# Patient Record
Sex: Male | Born: 1950 | Hispanic: Yes | Marital: Single | Smoking: Never smoker
Health system: Southern US, Community
[De-identification: ages and names within clinical notes are randomized; demographics above are authoritative.]

---

## 2014-03-16 HISTORY — PX: TENDON REPAIR: SHX5111

## 2019-03-17 HISTORY — PX: FRACTURE SURGERY: SHX138

## 2019-07-26 ENCOUNTER — Inpatient Hospital Stay (HOSPITAL_COMMUNITY)
Admission: EM | Admit: 2019-07-26 | Discharge: 2019-08-04 | DRG: 488 | Disposition: A | Discharge status: Home / Self Care | Payer: Medicaid Other | Attending: Orthopedic Surgery | Admitting: Orthopedic Surgery

## 2019-07-26 ENCOUNTER — Emergency Department (HOSPITAL_COMMUNITY): Payer: Medicaid Other

## 2019-07-26 ENCOUNTER — Inpatient Hospital Stay (HOSPITAL_COMMUNITY): Payer: Medicaid Other

## 2019-07-26 DIAGNOSIS — E8889 Other specified metabolic disorders: Secondary | ICD-10-CM | POA: Diagnosis present

## 2019-07-26 DIAGNOSIS — S83282A Other tear of lateral meniscus, current injury, left knee, initial encounter: Secondary | ICD-10-CM | POA: Diagnosis present

## 2019-07-26 DIAGNOSIS — S52182A Other fracture of upper end of left radius, initial encounter for closed fracture: Secondary | ICD-10-CM

## 2019-07-26 DIAGNOSIS — Y990 Civilian activity done for income or pay: Secondary | ICD-10-CM

## 2019-07-26 DIAGNOSIS — Z7289 Other problems related to lifestyle: Secondary | ICD-10-CM | POA: Diagnosis not present

## 2019-07-26 DIAGNOSIS — T148XXA Other injury of unspecified body region, initial encounter: Secondary | ICD-10-CM

## 2019-07-26 DIAGNOSIS — E559 Vitamin D deficiency, unspecified: Secondary | ICD-10-CM | POA: Diagnosis present

## 2019-07-26 DIAGNOSIS — W19XXXA Unspecified fall, initial encounter: Secondary | ICD-10-CM

## 2019-07-26 DIAGNOSIS — S82143A Displaced bicondylar fracture of unspecified tibia, initial encounter for closed fracture: Secondary | ICD-10-CM | POA: Diagnosis present

## 2019-07-26 DIAGNOSIS — S82142A Displaced bicondylar fracture of left tibia, initial encounter for closed fracture: Principal | ICD-10-CM | POA: Diagnosis present

## 2019-07-26 DIAGNOSIS — M25062 Hemarthrosis, left knee: Secondary | ICD-10-CM | POA: Diagnosis present

## 2019-07-26 DIAGNOSIS — Z419 Encounter for procedure for purposes other than remedying health state, unspecified: Secondary | ICD-10-CM

## 2019-07-26 DIAGNOSIS — F17210 Nicotine dependence, cigarettes, uncomplicated: Secondary | ICD-10-CM | POA: Diagnosis present

## 2019-07-26 DIAGNOSIS — T79A22A Traumatic compartment syndrome of left lower extremity, initial encounter: Secondary | ICD-10-CM | POA: Diagnosis present

## 2019-07-26 DIAGNOSIS — W1789XA Other fall from one level to another, initial encounter: Secondary | ICD-10-CM | POA: Diagnosis present

## 2019-07-26 DIAGNOSIS — D62 Acute posthemorrhagic anemia: Secondary | ICD-10-CM | POA: Diagnosis not present

## 2019-07-26 DIAGNOSIS — Y9389 Activity, other specified: Secondary | ICD-10-CM

## 2019-07-26 DIAGNOSIS — S82102A Unspecified fracture of upper end of left tibia, initial encounter for closed fracture: Secondary | ICD-10-CM

## 2019-07-26 DIAGNOSIS — S52121A Displaced fracture of head of right radius, initial encounter for closed fracture: Secondary | ICD-10-CM

## 2019-07-26 DIAGNOSIS — Z20822 Contact with and (suspected) exposure to covid-19: Secondary | ICD-10-CM | POA: Diagnosis present

## 2019-07-26 DIAGNOSIS — S52132A Displaced fracture of neck of left radius, initial encounter for closed fracture: Secondary | ICD-10-CM | POA: Diagnosis present

## 2019-07-26 DIAGNOSIS — Y92524 Gas station as the place of occurrence of the external cause: Secondary | ICD-10-CM

## 2019-07-26 LAB — CBC WITH DIFFERENTIAL/PLATELET
Abs Immature Granulocytes: 0.05 10*3/uL (ref 0.00–0.07)
Basophils Absolute: 0 10*3/uL (ref 0.0–0.1)
Basophils Relative: 0 %
Eosinophils Absolute: 0.3 10*3/uL (ref 0.0–0.5)
Eosinophils Relative: 3 %
HCT: 39.7 % (ref 39.0–52.0)
Hemoglobin: 13.3 g/dL (ref 13.0–17.0)
Immature Granulocytes: 0 %
Lymphocytes Relative: 12 %
Lymphs Abs: 1.3 10*3/uL (ref 0.7–4.0)
MCH: 31.8 pg (ref 26.0–34.0)
MCHC: 33.5 g/dL (ref 30.0–36.0)
MCV: 95 fL (ref 80.0–100.0)
Monocytes Absolute: 0.7 10*3/uL (ref 0.1–1.0)
Monocytes Relative: 6 %
Neutro Abs: 8.8 10*3/uL — ABNORMAL HIGH (ref 1.7–7.7)
Neutrophils Relative %: 79 %
Platelets: 240 10*3/uL (ref 150–400)
RBC: 4.18 MIL/uL — ABNORMAL LOW (ref 4.22–5.81)
RDW: 12.8 % (ref 11.5–15.5)
WBC: 11.2 10*3/uL — ABNORMAL HIGH (ref 4.0–10.5)
nRBC: 0 % (ref 0.0–0.2)

## 2019-07-26 LAB — BASIC METABOLIC PANEL
Anion gap: 8 (ref 5–15)
BUN: 13 mg/dL (ref 8–23)
CO2: 24 mmol/L (ref 22–32)
Calcium: 8.9 mg/dL (ref 8.9–10.3)
Chloride: 104 mmol/L (ref 98–111)
Creatinine, Ser: 0.85 mg/dL (ref 0.61–1.24)
GFR calc Af Amer: >60 mL/min (ref >60)
GFR calc non Af Amer: >60 mL/min (ref >60)
Glucose, Bld: 96 mg/dL (ref 70–99)
Potassium: 4.2 mmol/L (ref 3.5–5.1)
Sodium: 136 mmol/L (ref 135–145)

## 2019-07-26 LAB — SARS CORONAVIRUS 2 BY RT PCR (HOSPITAL ORDER, PERFORMED IN ~~LOC~~ HOSPITAL LAB): SARS Coronavirus 2: NEGATIVE

## 2019-07-26 MED ORDER — MORPHINE SULFATE (PF) 4 MG/ML IV SOLN
4.0000 mg | INTRAVENOUS | Status: DC | PRN
  Administered 2019-07-26: 23:00:00 4 mg via INTRAVENOUS
  Filled 2019-07-26: qty 1

## 2019-07-26 MED ORDER — POTASSIUM CHLORIDE IN NACL 20-0.9 MEQ/L-% IV SOLN
INTRAVENOUS | Status: DC
  Filled 2019-07-26 (×2): qty 1000

## 2019-07-26 MED ORDER — CEFAZOLIN SODIUM-DEXTROSE 2-4 GM/100ML-% IV SOLN
2.0000 g | INTRAVENOUS | Status: AC
  Administered 2019-07-27: 2 g via INTRAVENOUS
  Filled 2019-07-26: qty 100

## 2019-07-26 MED ORDER — HYDROMORPHONE HCL 1 MG/ML IJ SOLN
0.5000 mg | Freq: Once | INTRAMUSCULAR | Status: AC
  Administered 2019-07-26: 17:00:00 0.5 mg via INTRAVENOUS
  Filled 2019-07-26: qty 1

## 2019-07-26 MED ORDER — CHLORHEXIDINE GLUCONATE 4 % EX LIQD
60.0000 mL | Freq: Once | CUTANEOUS | Status: DC
  Administered 2019-07-27: 4 via TOPICAL

## 2019-07-26 MED ORDER — HYDROCODONE-ACETAMINOPHEN 5-325 MG PO TABS
2.0000 | ORAL_TABLET | ORAL | Status: DC | PRN
  Administered 2019-07-27: 2 via ORAL
  Filled 2019-07-26: qty 2

## 2019-07-26 MED ORDER — POVIDONE-IODINE 10 % EX SWAB: 2.0000 "application " | Freq: Once | CUTANEOUS | Status: DC

## 2019-07-26 MED ORDER — MORPHINE SULFATE (PF) 4 MG/ML IV SOLN: 4.0000 mg | Freq: Once | INTRAVENOUS | Status: DC
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
## 2019-07-26 NOTE — Progress Notes (Addendum)
Orthopedic Tech Progress Note Patient Details:  Jaideep Pollack Jul 23, 1950 [id number]  Ortho Devices Type of Ortho Device: Arm sling, Post (long arm) splint, Post (long leg) splint Ortho Device/Splint Location: lue, lle Ortho Device/Splint Interventions: Ordered, Application, Adjustment  I assisted tevin with application Post Interventions Patient Tolerated: Well Instructions Provided: Care of device, Adjustment of device   Trinna Post 07/26/2019, 8:57 PM
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
## 2019-07-26 NOTE — Progress Notes (Signed)
Radiographs of elbow and knee reviewed.  Comminuted radial head fracture present.  Complex tibial plateau fracture lateral aspect also present.  Plan for admission and evaluation by trauma service.  I have discussed these findings with Dr. Carola Frost.  Long-leg splint plus posterior splint for the arm.  N.p.o. after midnight.
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
## 2019-07-26 NOTE — Progress Notes (Signed)
Orthopedic Tech Progress Note Patient Details:  Kennard Fildes 21-Dec-1950 [id number]  Patient ID: Salik Grewell, male   DOB: 08-06-1950, 69 y.o.   MRN: [id number] Removal and reapplication of splint with extra bulky padding at dr deans request with dr deans assist.  Trinna Post 07/26/2019, 10:53 PM

## 2019-07-27 ENCOUNTER — Inpatient Hospital Stay (HOSPITAL_COMMUNITY): Payer: Medicaid Other

## 2019-07-27 ENCOUNTER — Encounter (HOSPITAL_COMMUNITY): Payer: Self-pay | Admitting: Orthopedic Surgery

## 2019-07-27 ENCOUNTER — Encounter (HOSPITAL_COMMUNITY)
Admission: EM | Disposition: A | Discharge status: Home / Self Care | Payer: Self-pay | Source: Home / Self Care | Attending: Orthopedic Surgery

## 2019-07-27 ENCOUNTER — Inpatient Hospital Stay (HOSPITAL_COMMUNITY): Payer: Medicaid Other | Admitting: Anesthesiology

## 2019-07-27 ENCOUNTER — Other Ambulatory Visit: Payer: Self-pay

## 2019-07-27 DIAGNOSIS — S82142A Displaced bicondylar fracture of left tibia, initial encounter for closed fracture: Secondary | ICD-10-CM | POA: Diagnosis present

## 2019-07-27 HISTORY — PX: EXTERNAL FIXATION LEG: SHX1549

## 2019-07-27 HISTORY — PX: FASCIOTOMY: SHX132

## 2019-07-27 HISTORY — DX: Displaced bicondylar fracture of left tibia, initial encounter for closed fracture: S82.142A

## 2019-07-27 HISTORY — PX: APPLICATION OF WOUND VAC: SHX5189

## 2019-07-27 HISTORY — PX: ORIF ELBOW FRACTURE: SHX5031

## 2019-07-27 LAB — HIV ANTIBODY (ROUTINE TESTING W REFLEX): HIV Screen 4th Generation wRfx: NONREACTIVE

## 2019-07-27 LAB — SURGICAL PCR SCREEN
MRSA, PCR: NEGATIVE
Staphylococcus aureus: NEGATIVE

## 2019-07-27 LAB — PHOSPHORUS: Phosphorus: 4 mg/dL (ref 2.5–4.6)

## 2019-07-27 LAB — MAGNESIUM: Magnesium: 1.9 mg/dL (ref 1.7–2.4)

## 2019-07-27 SURGERY — EXTERNAL FIXATION, LOWER EXTREMITY
Anesthesia: General | Site: Leg Lower | Laterality: Left

## 2019-07-27 MED ORDER — ONDANSETRON HCL 4 MG/2ML IJ SOLN
INTRAMUSCULAR | Status: AC
  Filled 2019-07-27: qty 2

## 2019-07-27 MED ORDER — LORAZEPAM 1 MG PO TABS: 1.0000 mg | ORAL_TABLET | ORAL | Status: AC | PRN

## 2019-07-27 MED ORDER — ONDANSETRON HCL 4 MG/2ML IJ SOLN: 4.0000 mg | Freq: Once | INTRAMUSCULAR | Status: DC | PRN

## 2019-07-27 MED ORDER — ENOXAPARIN SODIUM 40 MG/0.4ML ~~LOC~~ SOLN
40.0000 mg | SUBCUTANEOUS | Status: DC
  Administered 2019-07-28 – 2019-08-04 (×7): 40 mg via SUBCUTANEOUS
  Filled 2019-07-27 (×7): qty 0.4

## 2019-07-27 MED ORDER — ASCORBIC ACID 500 MG PO TABS
500.0000 mg | ORAL_TABLET | Freq: Every day | ORAL | Status: DC
  Administered 2019-07-28 – 2019-08-01 (×4): 500 mg via ORAL
  Filled 2019-07-27 (×4): qty 1

## 2019-07-27 MED ORDER — HYDROCODONE-ACETAMINOPHEN 5-325 MG PO TABS
1.0000 | ORAL_TABLET | Freq: Four times a day (QID) | ORAL | Status: DC | PRN
  Administered 2019-07-27 – 2019-08-02 (×5): 2 via ORAL
  Administered 2019-08-03: 1 via ORAL
  Filled 2019-07-27 (×2): qty 2
  Filled 2019-07-27: qty 1
  Filled 2019-07-27 (×3): qty 2

## 2019-07-27 MED ORDER — GLYCOPYRROLATE 0.2 MG/ML IJ SOLN
INTRAMUSCULAR | Status: DC | PRN
Start: 2019-07-27 — End: 2019-07-27
  Administered 2019-07-27 (×2): .1 mg via INTRAVENOUS

## 2019-07-27 MED ORDER — FENTANYL CITRATE (PF) 250 MCG/5ML IJ SOLN
INTRAMUSCULAR | Status: AC
  Filled 2019-07-27: qty 5

## 2019-07-27 MED ORDER — METOCLOPRAMIDE HCL 5 MG PO TABS: 5.0000 mg | ORAL_TABLET | Freq: Three times a day (TID) | ORAL | Status: DC | PRN

## 2019-07-27 MED ORDER — ONE-A-DAY MENS PO TABS: 1.0000 | ORAL_TABLET | Freq: Every day | ORAL | Status: DC

## 2019-07-27 MED ORDER — LIDOCAINE 2% (20 MG/ML) 5 ML SYRINGE
INTRAMUSCULAR | Status: DC | PRN
  Administered 2019-07-27: 80 mg via INTRAVENOUS

## 2019-07-27 MED ORDER — SPIRITUS FRUMENTI
1.0000 | Freq: Three times a day (TID) | ORAL | Status: DC
  Administered 2019-07-27 – 2019-08-04 (×19): 1 via ORAL
  Filled 2019-07-27 (×26): qty 1

## 2019-07-27 MED ORDER — OXYCODONE HCL 5 MG PO TABS: 5.0000 mg | ORAL_TABLET | Freq: Once | ORAL | Status: DC | PRN

## 2019-07-27 MED ORDER — ONDANSETRON HCL 4 MG/2ML IJ SOLN
INTRAMUSCULAR | Status: DC | PRN
  Administered 2019-07-27: 4 mg via INTRAVENOUS

## 2019-07-27 MED ORDER — LACTATED RINGERS IV SOLN: INTRAVENOUS | Status: DC

## 2019-07-27 MED ORDER — LIDOCAINE 2% (20 MG/ML) 5 ML SYRINGE
INTRAMUSCULAR | Status: AC
  Filled 2019-07-27: qty 5

## 2019-07-27 MED ORDER — LORAZEPAM 2 MG/ML IJ SOLN: 1.0000 mg | INTRAMUSCULAR | Status: AC | PRN

## 2019-07-27 MED ORDER — FENTANYL CITRATE (PF) 100 MCG/2ML IJ SOLN
25.0000 ug | INTRAMUSCULAR | Status: DC | PRN
  Administered 2019-07-27: 50 ug via INTRAVENOUS

## 2019-07-27 MED ORDER — FENTANYL CITRATE (PF) 100 MCG/2ML IJ SOLN
INTRAMUSCULAR | Status: AC
  Filled 2019-07-27: qty 2

## 2019-07-27 MED ORDER — ROCURONIUM BROMIDE 10 MG/ML (PF) SYRINGE
PREFILLED_SYRINGE | INTRAVENOUS | Status: AC
  Filled 2019-07-27: qty 10

## 2019-07-27 MED ORDER — METOCLOPRAMIDE HCL 5 MG/ML IJ SOLN: 5.0000 mg | Freq: Three times a day (TID) | INTRAMUSCULAR | Status: DC | PRN

## 2019-07-27 MED ORDER — POTASSIUM CHLORIDE IN NACL 20-0.9 MEQ/L-% IV SOLN
INTRAVENOUS | Status: DC
  Filled 2019-07-27 (×3): qty 1000

## 2019-07-27 MED ORDER — PHENYLEPHRINE 40 MCG/ML (10ML) SYRINGE FOR IV PUSH (FOR BLOOD PRESSURE SUPPORT)
PREFILLED_SYRINGE | INTRAVENOUS | Status: DC | PRN
  Administered 2019-07-27 (×2): 120 ug via INTRAVENOUS

## 2019-07-27 MED ORDER — PHENYLEPHRINE HCL-NACL 10-0.9 MG/250ML-% IV SOLN
INTRAVENOUS | Status: DC | PRN
  Administered 2019-07-27: 25 ug/min via INTRAVENOUS

## 2019-07-27 MED ORDER — ONDANSETRON HCL 4 MG/2ML IJ SOLN
4.0000 mg | Freq: Four times a day (QID) | INTRAMUSCULAR | Status: DC | PRN
  Administered 2019-07-31: 4 mg via INTRAVENOUS

## 2019-07-27 MED ORDER — MIDAZOLAM HCL 2 MG/2ML IJ SOLN
INTRAMUSCULAR | Status: AC
  Filled 2019-07-27: qty 2

## 2019-07-27 MED ORDER — DEXAMETHASONE SODIUM PHOSPHATE 10 MG/ML IJ SOLN
INTRAMUSCULAR | Status: DC | PRN
  Administered 2019-07-27: 5 mg via INTRAVENOUS

## 2019-07-27 MED ORDER — METHOCARBAMOL 1000 MG/10ML IJ SOLN
500.0000 mg | Freq: Four times a day (QID) | INTRAVENOUS | Status: DC | PRN
  Filled 2019-07-27: qty 5

## 2019-07-27 MED ORDER — MORPHINE SULFATE (PF) 2 MG/ML IV SOLN: 0.5000 mg | INTRAVENOUS | Status: DC | PRN

## 2019-07-27 MED ORDER — THIAMINE HCL 100 MG PO TABS
100.0000 mg | ORAL_TABLET | Freq: Every day | ORAL | Status: DC
  Administered 2019-07-28 – 2019-08-04 (×8): 100 mg via ORAL
  Filled 2019-07-27 (×8): qty 1

## 2019-07-27 MED ORDER — ACETAMINOPHEN 500 MG PO TABS
500.0000 mg | ORAL_TABLET | Freq: Two times a day (BID) | ORAL | Status: DC
  Administered 2019-07-27 – 2019-08-04 (×14): 500 mg via ORAL
  Filled 2019-07-27 (×14): qty 1

## 2019-07-27 MED ORDER — DOCUSATE SODIUM 100 MG PO CAPS
100.0000 mg | ORAL_CAPSULE | Freq: Two times a day (BID) | ORAL | Status: DC
  Administered 2019-07-27 – 2019-08-04 (×15): 100 mg via ORAL
  Filled 2019-07-27 (×15): qty 1

## 2019-07-27 MED ORDER — ONDANSETRON HCL 4 MG PO TABS: 4.0000 mg | ORAL_TABLET | Freq: Four times a day (QID) | ORAL | Status: DC | PRN

## 2019-07-27 MED ORDER — ADULT MULTIVITAMIN W/MINERALS CH
1.0000 | ORAL_TABLET | Freq: Every day | ORAL | Status: DC
  Administered 2019-07-28 – 2019-08-04 (×8): 1 via ORAL
  Filled 2019-07-27 (×8): qty 1

## 2019-07-27 MED ORDER — PHENYLEPHRINE 40 MCG/ML (10ML) SYRINGE FOR IV PUSH (FOR BLOOD PRESSURE SUPPORT)
PREFILLED_SYRINGE | INTRAVENOUS | Status: AC
  Filled 2019-07-27: qty 10

## 2019-07-27 MED ORDER — EPHEDRINE SULFATE-NACL 50-0.9 MG/10ML-% IV SOSY
PREFILLED_SYRINGE | INTRAVENOUS | Status: DC | PRN
  Administered 2019-07-27: 5 mg via INTRAVENOUS

## 2019-07-27 MED ORDER — PHENYLEPHRINE HCL (PRESSORS) 10 MG/ML IV SOLN
INTRAVENOUS | Status: AC
  Filled 2019-07-27: qty 1

## 2019-07-27 MED ORDER — OXYCODONE HCL 5 MG/5ML PO SOLN: 5.0000 mg | Freq: Once | ORAL | Status: DC | PRN

## 2019-07-27 MED ORDER — PROPOFOL 10 MG/ML IV BOLUS
INTRAVENOUS | Status: DC | PRN
  Administered 2019-07-27: 170 mg via INTRAVENOUS

## 2019-07-27 MED ORDER — THIAMINE HCL 100 MG/ML IJ SOLN
100.0000 mg | Freq: Every day | INTRAMUSCULAR | Status: DC
  Filled 2019-07-27: qty 2

## 2019-07-27 MED ORDER — CEFAZOLIN SODIUM-DEXTROSE 1-4 GM/50ML-% IV SOLN
1.0000 g | Freq: Four times a day (QID) | INTRAVENOUS | Status: AC
  Administered 2019-07-27 – 2019-07-28 (×3): 1 g via INTRAVENOUS
  Filled 2019-07-27 (×3): qty 50

## 2019-07-27 MED ORDER — MIDAZOLAM HCL 5 MG/5ML IJ SOLN
INTRAMUSCULAR | Status: DC | PRN
  Administered 2019-07-27: 2 mg via INTRAVENOUS

## 2019-07-27 MED ORDER — FENTANYL CITRATE (PF) 250 MCG/5ML IJ SOLN
INTRAMUSCULAR | Status: DC | PRN
  Administered 2019-07-27 (×2): 50 ug via INTRAVENOUS

## 2019-07-27 MED ORDER — SUGAMMADEX SODIUM 200 MG/2ML IV SOLN
INTRAVENOUS | Status: DC | PRN
  Administered 2019-07-27 (×2): 100 mg via INTRAVENOUS

## 2019-07-27 MED ORDER — PROPOFOL 10 MG/ML IV BOLUS
INTRAVENOUS | Status: AC
  Filled 2019-07-27: qty 20

## 2019-07-27 MED ORDER — HYDROCODONE-ACETAMINOPHEN 7.5-325 MG PO TABS
1.0000 | ORAL_TABLET | Freq: Four times a day (QID) | ORAL | Status: DC | PRN
  Administered 2019-07-28 – 2019-08-03 (×9): 2 via ORAL
  Filled 2019-07-27 (×9): qty 2

## 2019-07-27 MED ORDER — DEXAMETHASONE SODIUM PHOSPHATE 10 MG/ML IJ SOLN
INTRAMUSCULAR | Status: AC
  Filled 2019-07-27: qty 1

## 2019-07-27 MED ORDER — METHOCARBAMOL 500 MG PO TABS
500.0000 mg | ORAL_TABLET | Freq: Four times a day (QID) | ORAL | Status: DC | PRN
  Administered 2019-07-28 – 2019-08-03 (×5): 500 mg via ORAL
  Filled 2019-07-27 (×7): qty 1

## 2019-07-27 MED ORDER — SENNOSIDES-DOCUSATE SODIUM 8.6-50 MG PO TABS: 1.0000 | ORAL_TABLET | Freq: Every evening | ORAL | Status: DC | PRN

## 2019-07-27 MED ORDER — ROCURONIUM BROMIDE 10 MG/ML (PF) SYRINGE
PREFILLED_SYRINGE | INTRAVENOUS | Status: DC | PRN
  Administered 2019-07-27: 20 mg via INTRAVENOUS
  Administered 2019-07-27: 60 mg via INTRAVENOUS

## 2019-07-27 MED ORDER — FOLIC ACID 1 MG PO TABS
1.0000 mg | ORAL_TABLET | Freq: Every day | ORAL | Status: DC
  Administered 2019-07-28 – 2019-08-04 (×8): 1 mg via ORAL
  Filled 2019-07-27 (×7): qty 1

## 2019-07-27 MED ORDER — ACETAMINOPHEN 325 MG PO TABS
325.0000 mg | ORAL_TABLET | Freq: Four times a day (QID) | ORAL | Status: DC | PRN
  Administered 2019-08-03: 650 mg via ORAL
  Filled 2019-07-27: qty 2

## 2019-07-27 MED ORDER — EPHEDRINE 5 MG/ML INJ
INTRAVENOUS | Status: AC
  Filled 2019-07-27: qty 10

## 2019-07-27 MED ORDER — GLYCOPYRROLATE PF 0.2 MG/ML IJ SOSY
PREFILLED_SYRINGE | INTRAMUSCULAR | Status: AC
  Filled 2019-07-27: qty 1

## 2019-07-27 MED ORDER — PANTOPRAZOLE SODIUM 40 MG PO TBEC
40.0000 mg | DELAYED_RELEASE_TABLET | Freq: Every day | ORAL | Status: DC
  Administered 2019-07-28 – 2019-08-04 (×7): 40 mg via ORAL
  Filled 2019-07-27 (×7): qty 1

## 2019-07-27 MED ORDER — 0.9 % SODIUM CHLORIDE (POUR BTL) OPTIME
TOPICAL | Status: DC | PRN
  Administered 2019-07-27: 1000 mL

## 2019-07-27 SURGICAL SUPPLY — 97 items
BANDAGE ESMARK 6X9 LF (GAUZE/BANDAGES/DRESSINGS) ×2 IMPLANT
BAR GLASS FIBER EXFX 11X500 (EXFIX) ×4 IMPLANT
BENZOIN TINCTURE PRP APPL 2/3 (GAUZE/BANDAGES/DRESSINGS) ×2 IMPLANT
BLADE AVERAGE 25MMX9MM (BLADE) ×1
BLADE AVERAGE 25X9 (BLADE) ×3 IMPLANT
BLADE CLIPPER SURG (BLADE) ×4 IMPLANT
BLADE SURG 10 STRL SS (BLADE) IMPLANT
BNDG COHESIVE 4X5 TAN STRL (GAUZE/BANDAGES/DRESSINGS) ×4 IMPLANT
BNDG COHESIVE 6X5 TAN STRL LF (GAUZE/BANDAGES/DRESSINGS) IMPLANT
BNDG ELASTIC 3X5.8 VLCR STR LF (GAUZE/BANDAGES/DRESSINGS) ×4 IMPLANT
BNDG ELASTIC 4X5.8 VLCR STR LF (GAUZE/BANDAGES/DRESSINGS) ×4 IMPLANT
BNDG ELASTIC 6X10 VLCR STRL LF (GAUZE/BANDAGES/DRESSINGS) ×2 IMPLANT
BNDG ELASTIC 6X5.8 VLCR STR LF (GAUZE/BANDAGES/DRESSINGS) ×4 IMPLANT
BNDG ESMARK 4X9 LF (GAUZE/BANDAGES/DRESSINGS) ×2 IMPLANT
BNDG ESMARK 6X9 LF (GAUZE/BANDAGES/DRESSINGS)
BNDG GAUZE ELAST 4 BULKY (GAUZE/BANDAGES/DRESSINGS) ×10 IMPLANT
BRUSH SCRUB EZ PLAIN DRY (MISCELLANEOUS) ×8 IMPLANT
CANISTER WOUND CARE 500ML ATS (WOUND CARE) ×2 IMPLANT
CLEANER TIP ELECTROSURG 2X2 (MISCELLANEOUS) ×2 IMPLANT
CLOSURE WOUND 1/2 X4 (GAUZE/BANDAGES/DRESSINGS) ×1
COVER SURGICAL LIGHT HANDLE (MISCELLANEOUS) ×6 IMPLANT
COVER WAND RF STERILE (DRAPES) ×2 IMPLANT
CUFF TOURN SGL QUICK 18X4 (TOURNIQUET CUFF) IMPLANT
CUFF TOURN SGL QUICK 24 (TOURNIQUET CUFF)
CUFF TRNQT CYL 24X4X16.5-23 (TOURNIQUET CUFF) IMPLANT
DECANTER SPIKE VIAL GLASS SM (MISCELLANEOUS) IMPLANT
DRAPE C-ARM 42X72 X-RAY (DRAPES) ×2 IMPLANT
DRAPE C-ARMOR (DRAPES) ×4 IMPLANT
DRAPE IMP U-DRAPE 54X76 (DRAPES) ×2 IMPLANT
DRAPE INCISE IOBAN 66X45 STRL (DRAPES) IMPLANT
DRAPE U-SHAPE 47X51 STRL (DRAPES) ×4 IMPLANT
DRSG ADAPTIC 3X8 NADH LF (GAUZE/BANDAGES/DRESSINGS) ×4 IMPLANT
DRSG EMULSION OIL 3X3 NADH (GAUZE/BANDAGES/DRESSINGS) IMPLANT
DRSG MEPITEL 4X7.2 (GAUZE/BANDAGES/DRESSINGS) IMPLANT
DRSG PAD ABDOMINAL 8X10 ST (GAUZE/BANDAGES/DRESSINGS) ×2 IMPLANT
DRSG VAC ATS MED SENSATRAC (GAUZE/BANDAGES/DRESSINGS) ×2 IMPLANT
DRSG XEROFORM 1X8 (GAUZE/BANDAGES/DRESSINGS) ×2 IMPLANT
ELECT REM PT RETURN 9FT ADLT (ELECTROSURGICAL) ×4
ELECTRODE REM PT RTRN 9FT ADLT (ELECTROSURGICAL) ×2 IMPLANT
EVACUATOR 1/8 PVC DRAIN (DRAIN) IMPLANT
FACESHIELD WRAPAROUND (MASK) IMPLANT
FACESHIELD WRAPAROUND OR TEAM (MASK) IMPLANT
GAUZE SPONGE 4X4 12PLY STRL (GAUZE/BANDAGES/DRESSINGS) ×4 IMPLANT
GAUZE XEROFORM 1X8 LF (GAUZE/BANDAGES/DRESSINGS) ×4 IMPLANT
GAUZE XEROFORM 5X9 LF (GAUZE/BANDAGES/DRESSINGS) ×4 IMPLANT
GLOVE BIO SURGEON STRL SZ7.5 (GLOVE) ×6 IMPLANT
GLOVE BIO SURGEON STRL SZ8 (GLOVE) ×6 IMPLANT
GLOVE BIOGEL PI IND STRL 7.5 (GLOVE) ×2 IMPLANT
GLOVE BIOGEL PI IND STRL 8 (GLOVE) ×2 IMPLANT
GLOVE BIOGEL PI INDICATOR 7.5 (GLOVE) ×2
GLOVE BIOGEL PI INDICATOR 8 (GLOVE) ×2
GOWN STRL REUS W/ TWL LRG LVL3 (GOWN DISPOSABLE) ×4 IMPLANT
GOWN STRL REUS W/ TWL XL LVL3 (GOWN DISPOSABLE) ×2 IMPLANT
GOWN STRL REUS W/TWL LRG LVL3 (GOWN DISPOSABLE) ×4
GOWN STRL REUS W/TWL XL LVL3 (GOWN DISPOSABLE) ×2
HALF PIN 5.0X160 (EXFIX) ×4 IMPLANT
HEAD SLIDE-LOC KNEE 20 MM LT (Joint) IMPLANT
KIT BASIN OR (CUSTOM PROCEDURE TRAY) ×4 IMPLANT
KIT TURNOVER KIT B (KITS) ×4 IMPLANT
MANIFOLD NEPTUNE II (INSTRUMENTS) ×2 IMPLANT
NEEDLE 22X1 1/2 (OR ONLY) (NEEDLE) IMPLANT
NS IRRIG 1000ML POUR BTL (IV SOLUTION) ×4 IMPLANT
PACK ORTHO EXTREMITY (CUSTOM PROCEDURE TRAY) ×4 IMPLANT
PAD ARMBOARD 7.5X6 YLW CONV (MISCELLANEOUS) ×8 IMPLANT
PAD CAST 4YDX4 CTTN HI CHSV (CAST SUPPLIES) ×2 IMPLANT
PADDING CAST COTTON 4X4 STRL (CAST SUPPLIES) ×2
PADDING CAST COTTON 6X4 STRL (CAST SUPPLIES) ×4 IMPLANT
PENCIL BUTTON HOLSTER BLD 10FT (ELECTRODE) ×2 IMPLANT
PIN CLAMP 2BAR 75MM BLUE (EXFIX) ×4 IMPLANT
PIN HALF YELLOW 5X160X35 (EXFIX) ×6 IMPLANT
SLIDE-LOC HEAD KNEE 20 MM LT (Joint) ×4 IMPLANT
SPONGE LAP 18X18 RF (DISPOSABLE) ×8 IMPLANT
SPONGE LAP 18X18 X RAY DECT (DISPOSABLE) ×2 IMPLANT
STAPLER VISISTAT 35W (STAPLE) ×2 IMPLANT
STEM MORSE LONG STEM 9 (Stem) ×2 IMPLANT
STOCKINETTE IMPERVIOUS LG (DRAPES) IMPLANT
STRIP CLOSURE SKIN 1/2X4 (GAUZE/BANDAGES/DRESSINGS) ×1 IMPLANT
SUCTION FRAZIER HANDLE 10FR (MISCELLANEOUS) ×2
SUCTION TUBE FRAZIER 10FR DISP (MISCELLANEOUS) ×2 IMPLANT
SUT FIBERWIRE 2-0 18 17.9 3/8 (SUTURE) ×4
SUT MNCRL AB 3-0 PS2 27 (SUTURE) ×2 IMPLANT
SUT PDS AB 2-0 CT1 27 (SUTURE) IMPLANT
SUT PROLENE 3 0 PS 2 (SUTURE) ×4 IMPLANT
SUT VIC AB 0 CT1 27 (SUTURE) ×2
SUT VIC AB 0 CT1 27XBRD ANBCTR (SUTURE) ×4 IMPLANT
SUT VIC AB 2-0 CT1 27 (SUTURE) ×2
SUT VIC AB 2-0 CT1 TAPERPNT 27 (SUTURE) ×4 IMPLANT
SUT VIC AB 2-0 CT3 27 (SUTURE) IMPLANT
SUTURE FIBERWR 2-0 18 17.9 3/8 (SUTURE) IMPLANT
SYR CONTROL 10ML LL (SYRINGE) ×4 IMPLANT
TOWEL GREEN STERILE (TOWEL DISPOSABLE) ×8 IMPLANT
TOWEL GREEN STERILE FF (TOWEL DISPOSABLE) ×8 IMPLANT
TUBE CONNECTING 12'X1/4 (SUCTIONS) ×1
TUBE CONNECTING 12X1/4 (SUCTIONS) ×3 IMPLANT
UNDERPAD 30X36 HEAVY ABSORB (UNDERPADS AND DIAPERS) ×4 IMPLANT
WATER STERILE IRR 1000ML POUR (IV SOLUTION) ×4 IMPLANT
YANKAUER SUCT BULB TIP NO VENT (SUCTIONS) ×4 IMPLANT

## 2019-07-27 NOTE — Anesthesia Preprocedure Evaluation (Addendum)
Anesthesia Evaluation  Patient identified by MRN, date of birth, ID band Patient awake    Reviewed: Allergy & Precautions, NPO status , Patient's Chart, lab work & pertinent test results  History of Anesthesia Complications Negative for: history of anesthetic complications  Airway Mallampati: II  TM Distance: >3 FB Neck ROM: Full    Dental  (+) Dental Advisory Given, Poor Dentition, Missing   Pulmonary Current Smoker and Patient abstained from smoking.,    Pulmonary exam normal        Cardiovascular negative cardio ROS Normal cardiovascular exam     Neuro/Psych negative neurological ROS  negative psych ROS   GI/Hepatic negative GI ROS,  6 pack of beer/day, 3 days/week    Endo/Other  negative endocrine ROS  Renal/GU negative Renal ROS     Musculoskeletal negative musculoskeletal ROS (+)   Abdominal   Peds  Hematology negative hematology ROS (+)   Anesthesia Other Findings Covid neg 5/12   Reproductive/Obstetrics                            Anesthesia Physical Anesthesia Plan  ASA: II  Anesthesia Plan: General   Post-op Pain Management:    Induction: Intravenous  PONV Risk Score and Plan: 2 and Treatment may vary due to age or medical condition, Ondansetron, Dexamethasone and Midazolam  Airway Management Planned: Oral ETT  Additional Equipment: None  Intra-op Plan:   Post-operative Plan: Extubation in OR  Informed Consent: I have reviewed the patients History and Physical, chart, labs and discussed the procedure including the risks, benefits and alternatives for the proposed anesthesia with the patient or authorized representative who has indicated his/her understanding and acceptance.     Dental advisory given  Plan Discussed with: CRNA and Anesthesiologist  Anesthesia Plan Comments:        Anesthesia Quick Evaluation

## 2019-07-27 NOTE — Anesthesia Procedure Notes (Signed)
Procedure Name: Intubation Date/Time: 07/27/2019 12:28 PM Performed by: Trinna Post., CRNA Pre-anesthesia Checklist: Patient identified, Emergency Drugs available, Suction available, Patient being monitored and Timeout performed Patient Re-evaluated:Patient Re-evaluated prior to induction Oxygen Delivery Method: Circle system utilized Preoxygenation: Pre-oxygenation with 100% oxygen Induction Type: IV induction Ventilation: Mask ventilation without difficulty Laryngoscope Size: Mac and 4 Grade View: Grade I Tube type: Oral Tube size: 7.5 mm Number of attempts: 1 Airway Equipment and Method: Stylet Placement Confirmation: ETT inserted through vocal cords under direct vision,  positive ETCO2 and breath sounds checked- equal and bilateral Secured at: 22 cm Tube secured with: Tape Dental Injury: Teeth and Oropharynx as per pre-operative assessment

## 2019-07-27 NOTE — Plan of Care (Signed)
  Problem: Education: Goal: Knowledge of the prescribed therapeutic regimen will improve Outcome: Progressing   Problem: Pain Management: Goal: Pain level will decrease with appropriate interventions Outcome: Progressing   Problem: Skin Integrity: Goal: Will show signs of wound healing Outcome: Progressing   
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
## 2019-07-27 NOTE — Transfer of Care (Signed)
Immediate Anesthesia Transfer of Care Note  Patient: Bradley Brown  Procedure(s) Performed: EXTERNAL FIXATION LEFT TIBIA PLATEAU (Left Leg Lower) REPLACE REPAIR LEFT RADIAL HEAD (Left Elbow) Application Of Wound Vac (Left Leg Lower) Fasciotomy; lateral aspect (Left Leg Lower)  Patient Location: PACU  Anesthesia Type:General  Level of Consciousness: awake, alert  and oriented  Airway & Oxygen Therapy: Patient Spontanous Breathing and Patient connected to face mask oxygen  Post-op Assessment: Report given to RN and Post -op Vital signs reviewed and stable  Post vital signs: Reviewed and stable  Last Vitals:  Vitals Value Taken Time  BP 156/69 07/27/19 1555  Temp 36.7 C 07/27/19 1555  Pulse 72 07/27/19 1600  Resp 16 07/27/19 1600  SpO2 99 % 07/27/19 1600  Vitals shown include unvalidated device data.  Last Pain:  Vitals:   07/27/19 1555  TempSrc:   PainSc: (P) 0-No pain      Patients Stated Pain Goal: 0 (07/26/19 2249)  Complications: No apparent anesthesia complications

## 2019-07-28 ENCOUNTER — Encounter: Payer: Self-pay | Admitting: *Deleted

## 2019-07-28 LAB — COMPREHENSIVE METABOLIC PANEL
ALT: 13 U/L (ref 0–44)
AST: 24 U/L (ref 15–41)
Albumin: 3 g/dL — ABNORMAL LOW (ref 3.5–5.0)
Alkaline Phosphatase: 47 U/L (ref 38–126)
Anion gap: 7 (ref 5–15)
BUN: 9 mg/dL (ref 8–23)
CO2: 25 mmol/L (ref 22–32)
Calcium: 8.4 mg/dL — ABNORMAL LOW (ref 8.9–10.3)
Chloride: 104 mmol/L (ref 98–111)
Creatinine, Ser: 0.77 mg/dL (ref 0.61–1.24)
GFR calc Af Amer: >60 mL/min (ref >60)
GFR calc non Af Amer: >60 mL/min (ref >60)
Glucose, Bld: 127 mg/dL — ABNORMAL HIGH (ref 70–99)
Potassium: 3.9 mmol/L (ref 3.5–5.1)
Sodium: 136 mmol/L (ref 135–145)
Total Bilirubin: 0.6 mg/dL (ref 0.3–1.2)
Total Protein: 5.9 g/dL — ABNORMAL LOW (ref 6.5–8.1)

## 2019-07-28 LAB — CBC
HCT: 29.1 % — ABNORMAL LOW (ref 39.0–52.0)
Hemoglobin: 9.7 g/dL — ABNORMAL LOW (ref 13.0–17.0)
MCH: 31.7 pg (ref 26.0–34.0)
MCHC: 33.3 g/dL (ref 30.0–36.0)
MCV: 95.1 fL (ref 80.0–100.0)
Platelets: 181 10*3/uL (ref 150–400)
RBC: 3.06 MIL/uL — ABNORMAL LOW (ref 4.22–5.81)
RDW: 12.6 % (ref 11.5–15.5)
WBC: 11.2 10*3/uL — ABNORMAL HIGH (ref 4.0–10.5)
nRBC: 0 % (ref 0.0–0.2)

## 2019-07-28 LAB — VITAMIN D 25 HYDROXY (VIT D DEFICIENCY, FRACTURES): Vit D, 25-Hydroxy: 8.28 ng/mL — ABNORMAL LOW (ref 30–100)

## 2019-07-28 NOTE — Evaluation (Signed)
Occupational Therapy Evaluation Patient Details Name: Bradley Brown MRN: [id number] DOB: 09/26/1950 Today's Date: 07/28/2019    History of Present Illness 69 y.o. male presented to ED 07/26/19 after falling off back of truck resulting in left  tibial plateau fracture, elbow fracture radial head comminuted. S/p external fixation L tibia 07/27/19. PMH none on file.   Clinical Impression   Pt admitted with above. He demonstrates the below listed deficits and will benefit from continued OT to maximize safety and independence with BADLs.  Pt presents to OT with increased pain, and limitations due to NWB Lt UE and Lt LE. Pt currently requires set up assist - max A for ADLs and mod A +2 for functional transfers.  He reports he lives alone, and "can call someone" to assist him at discharge, but did not elaborate on what this means as far as assist at discharge.  Do feel with CIR, he will likely be able to progress to min guard to supervision at w/c level and would be able to discharge home with intermittent assist at w/c level.  OT will follow acutely.       Follow Up Recommendations  CIR;Supervision/Assistance - 24 hour    Equipment Recommendations  3 in 1 bedside commode;Tub/shower bench(drop arm 3in1 commode )    Recommendations for Other Services Rehab consult     Precautions / Restrictions Precautions Precautions: Fall Required Braces or Orthoses: Sling;Splint/Cast Splint/Cast: L ORIF, L UE cast (?) Restrictions Weight Bearing Restrictions: Yes LUE Weight Bearing: Non weight bearing LLE Weight Bearing: Non weight bearing      Mobility Bed Mobility Overal bed mobility: Needs Assistance Bed Mobility: Supine to Sit       Sit to supine: Min assist;+2 for safety/equipment   General bed mobility comments: Assist for LLE and trunk to EOB  Transfers Overall transfer level: Needs assistance Equipment used: 2 person hand held assist Transfers: Squat Pivot Transfers     Squat  pivot transfers: Min assist;+2 safety/equipment     General transfer comment: Pt required assist for guarding trunk and assist for LLE    Balance Overall balance assessment: Needs assistance Sitting-balance support: Single extremity supported Sitting balance-Leahy Scale: Fair       Standing balance-Leahy Scale: Zero Standing balance comment: Pt unable to stand and maintain NWB with ex fix in place                            ADL either performed or assessed with clinical judgement   ADL Overall ADL's : Needs assistance/impaired Eating/Feeding: Modified independent;Bed level   Grooming: Wash/dry hands;Wash/dry face;Oral care;Brushing hair;Set up;Sitting   Upper Body Bathing: Moderate assistance;Sitting   Lower Body Bathing: Moderate assistance;Bed level   Upper Body Dressing : Moderate assistance;Sitting   Lower Body Dressing: Maximal assistance;Bed level   Toilet Transfer: Moderate assistance;+2 for physical assistance;+2 for safety/equipment;Stand-pivot;BSC   Toileting- Clothing Manipulation and Hygiene: Maximal assistance;Sit to/from stand       Functional mobility during ADLs: Moderate assistance;+2 for physical assistance;+2 for safety/equipment       Vision Patient Visual Report: No change from baseline       Perception     Praxis      Pertinent Vitals/Pain Pain Assessment: Faces Faces Pain Scale: Hurts little more Pain Location: LLE Pain Descriptors / Indicators: Grimacing Pain Intervention(s): Monitored during session;Limited activity within patient's tolerance;Premedicated before session     Hand Dominance     Extremity/Trunk Assessment Upper  Extremity Assessment Upper Extremity Assessment: LUE deficits/detail LUE Deficits / Details: Lt UE in long arm cast - did not test    Lower Extremity Assessment Lower Extremity Assessment: Defer to PT evaluation LLE: Unable to fully assess due to pain;Unable to fully assess due to  immobilization   Cervical / Trunk Assessment Cervical / Trunk Assessment: Normal   Communication Communication Communication: Prefers language other than English;Interpreter utilized   Cognition Arousal/Alertness: Awake/alert Behavior During Therapy: WFL for tasks assessed/performed Overall Cognitive Status: No family/caregiver present to determine baseline cognitive functioning                                 General Comments: Pt with decreased recall of instructions requiring them to be repeated.  He denies LOC or hit to his head during the fall    General Comments  Video interpreter utilized Remi Deter [phone]. CIWA protocol initiated.    Exercises     Shoulder Instructions      Home Living Family/patient expects to be discharged to:: Private residence Living Arrangements: Alone Available Help at Discharge: Family;Available PRN/intermittently Type of Home: Apartment Home Access: Stairs to enter Entrance Stairs-Number of Steps: 2 Entrance Stairs-Rails: None Home Layout: One level     Bathroom Shower/Tub: IT trainer: Standard     Home Equipment: None          Prior Functioning/Environment Level of Independence: Independent        Comments: Pt  worked as a Education administrator, and was fully independent         OT Problem List: Decreased activity tolerance;Impaired balance (sitting and/or standing);Decreased cognition;Decreased safety awareness;Decreased knowledge of use of DME or AE;Decreased knowledge of precautions;Impaired UE functional use;Pain      OT Treatment/Interventions: Self-care/ADL training;DME and/or AE instruction;Therapeutic activities;Cognitive remediation/compensation;Patient/family education;Balance training;Therapeutic exercise    OT Goals(Current goals can be found in the care plan section) Acute Rehab OT Goals Patient Stated Goal: to go home  OT Goal Formulation: With patient Time For Goal Achievement:  08/11/19 Potential to Achieve Goals: Good ADL Goals Pt Will Perform Upper Body Bathing: with set-up;with supervision;sitting Pt Will Perform Lower Body Bathing: with supervision;bed level;sitting/lateral leans Pt Will Perform Upper Body Dressing: with set-up;with supervision;sitting Pt Will Perform Lower Body Dressing: with set-up;with supervision;with adaptive equipment;sitting/lateral leans;bed level Pt Will Transfer to Toilet: with transfer board;with min guard assist;squat pivot transfer;bedside commode Pt Will Perform Toileting - Clothing Manipulation and hygiene: with supervision;sitting/lateral leans  OT Frequency: Min 2X/week   Barriers to D/C: Decreased caregiver support          Co-evaluation PT/OT/SLP Co-Evaluation/Treatment: Yes Reason for Co-Treatment: Complexity of the patient's impairments (multi-system involvement);For patient/therapist safety;To address functional/ADL transfers PT goals addressed during session: Mobility/safety with mobility OT goals addressed during session: ADL's and self-care      AM-PAC OT "6 Clicks" Daily Activity     Outcome Measure Help from another person eating meals?: None Help from another person taking care of personal grooming?: A Little Help from another person toileting, which includes using toliet, bedpan, or urinal?: A Lot Help from another person bathing (including washing, rinsing, drying)?: A Lot Help from another person to put on and taking off regular upper body clothing?: A Lot Help from another person to put on and taking off regular lower body clothing?: A Lot 6 Click Score: 15   End of Session Equipment Utilized During Treatment: Gait belt Nurse Communication:  Mobility status  Activity Tolerance: Patient tolerated treatment well Patient left: in chair;with call bell/phone within reach;with chair alarm set  OT Visit Diagnosis: Pain;Unsteadiness on feet (R26.81) Pain - Right/Left: Left Pain - part of body: Leg                 Time: 8115-7262 OT Time Calculation (min): 37 min Charges:  OT General Charges $OT Visit: 1 Visit OT Evaluation $OT Eval Moderate Complexity: 1 Mod  Nilsa Nutting., OTR/L Acute Rehabilitation Services Pager [phone] Office [phone]   Lucille Passy M 07/28/2019, 3:27 PM

## 2019-07-28 NOTE — Anesthesia Postprocedure Evaluation (Signed)
Anesthesia Post Note  Patient: Bradley Brown  Procedure(s) Performed: EXTERNAL FIXATION LEFT TIBIA PLATEAU (Left Leg Lower) REPLACE REPAIR LEFT RADIAL HEAD (Left Elbow) Application Of Wound Vac (Left Leg Lower) Fasciotomy; lateral aspect (Left Leg Lower)     Patient location during evaluation: PACU Anesthesia Type: General Level of consciousness: awake and alert Pain management: pain level controlled Vital Signs Assessment: post-procedure vital signs reviewed and stable Respiratory status: spontaneous breathing, nonlabored ventilation and respiratory function stable Cardiovascular status: blood pressure returned to baseline and stable Postop Assessment: no apparent nausea or vomiting Anesthetic complications: no    Last Vitals:  Vitals:   07/28/19 0704 07/28/19 0745  BP: 117/73 123/72  Pulse: (!) 50 60  Resp: 17 16  Temp: (!) 36.3 C 36.8 C  SpO2: 100% 99%    Last Pain:  Vitals:   07/28/19 0745  TempSrc: Oral  PainSc: 0-No pain                 Beryle Lathe

## 2019-07-28 NOTE — Evaluation (Signed)
Physical Therapy Evaluation Patient Details Name: Bradley Brown MRN: [id number] DOB: July 10, 1950 Today's Date: 07/28/2019   History of Present Illness  69 y.o. male presented to ED 07/26/19 after falling off back of truck resulting in left  tibial plateau fracture, elbow fracture radial head comminuted. S/p external fixation L tibia 07/27/19. PMH none on file.  Clinical Impression  Pt presents with an overall decrease in functional mobility and independence and increase in pain secondary to above. PTA, pt independent living in one level apartment and working as a Curator. Educ on LUE/LLE NWB precautions with pt verbalizing precautions multiple times prior to transfer to chair and at the end of the session. Today, pt able to complete bed mobility and squat pivot transfer min(A)+2 for assist with trunk and LLE. Pt would benefit from continued acute PT services to maximize functional mobility and independence prior to d/c to next venue of care.      Follow Up Recommendations CIR    Equipment Recommendations  Wheelchair (measurements PT);Wheelchair cushion (measurements PT);3in1 (PT)    Recommendations for Other Services       Precautions / Restrictions Precautions Precautions: Fall Required Braces or Orthoses: Sling;Splint/Cast Splint/Cast: L ORIF, L UE cast (?) Restrictions Weight Bearing Restrictions: Yes LUE Weight Bearing: Non weight bearing LLE Weight Bearing: Non weight bearing      Mobility  Bed Mobility Overal bed mobility: Needs Assistance Bed Mobility: Supine to Sit       Sit to supine: Min assist;+2 for safety/equipment   General bed mobility comments: Assist for LLE and trunk to EOB  Transfers Overall transfer level: Needs assistance   Transfers: Squat Pivot Transfers     Squat pivot transfers: Min assist;+2 safety/equipment     General transfer comment: Pt required assist for guarding trunk and assist for LLE  Ambulation/Gait             General  Gait Details: unable  Stairs            Wheelchair Mobility    Modified Rankin (Stroke Patients Only)       Balance Overall balance assessment: Needs assistance   Sitting balance-Leahy Scale: Fair       Standing balance-Leahy Scale: Zero Standing balance comment: NWB precautions prevent standing                             Pertinent Vitals/Pain Pain Assessment: Faces Faces Pain Scale: Hurts little more Pain Location: LLE Pain Descriptors / Indicators: Grimacing Pain Intervention(s): Limited activity within patient's tolerance;Monitored during session;Premedicated before session    Home Living Family/patient expects to be discharged to:: Private residence Living Arrangements: Alone Available Help at Discharge: Family;Available PRN/intermittently Type of Home: Apartment Home Access: Stairs to enter Entrance Stairs-Rails: None Entrance Stairs-Number of Steps: 2 Home Layout: One level Home Equipment: None      Prior Function Level of Independence: Independent         Comments: Pt  worked as a Curator, and was fully independent      Journalist, newspaper        Extremity/Trunk Assessment   Upper Extremity Assessment Upper Extremity Assessment: Defer to OT evaluation    Lower Extremity Assessment Lower Extremity Assessment: LLE deficits/detail LLE: Unable to fully assess due to pain;Unable to fully assess due to immobilization       Communication   Communication: Prefers language other than [place];Interpreter utilized  Cognition Arousal/Alertness: Awake/alert Behavior During Therapy: WFL for tasks  assessed/performed Overall Cognitive Status: Within Functional Limits for tasks assessed                                 General Comments: Used interpretor for session      General Comments General comments (skin integrity, edema, etc.): Video interpreter utilized Remi Deter [phone]. CIWA protocol initiated.    Exercises      Assessment/Plan    PT Assessment Patient needs continued PT services  PT Problem List Decreased mobility;Decreased range of motion;Decreased balance;Pain       PT Treatment Interventions Therapeutic activities;Therapeutic exercise;Functional mobility training;Patient/family education    PT Goals (Current goals can be found in the Care Plan section)  Acute Rehab PT Goals Patient Stated Goal: home PT Goal Formulation: With patient Time For Goal Achievement: 08/11/19 Potential to Achieve Goals: Good    Frequency Min 5X/week   Barriers to discharge Inaccessible home environment Pt will be unable to walk by time of d/c. Has to get up 2 steps. Does report he can have family and friends come help him.    Co-evaluation PT/OT/SLP Co-Evaluation/Treatment: Yes Reason for Co-Treatment: Complexity of the patient's impairments (multi-system involvement);For patient/therapist safety PT goals addressed during session: Mobility/safety with mobility         AM-PAC PT "6 Clicks" Mobility  Outcome Measure Help needed turning from your back to your side while in a flat bed without using bedrails?: A Little Help needed moving from lying on your back to sitting on the side of a flat bed without using bedrails?: A Little Help needed moving to and from a bed to a chair (including a wheelchair)?: A Little Help needed standing up from a chair using your arms (e.g., wheelchair or bedside chair)?: Total Help needed to walk in hospital room?: Total Help needed climbing 3-5 steps with a railing? : Total 6 Click Score: 12    End of Session Equipment Utilized During Treatment: Gait belt Activity Tolerance: Patient tolerated treatment well Patient left: in chair;with call bell/phone within reach;with chair alarm set Nurse Communication: Mobility status PT Visit Diagnosis: Other abnormalities of gait and mobility (R26.89);Pain Pain - Right/Left: Left Pain - part of body: Arm;Leg    Time:  0981-1914 PT Time Calculation (min) (ACUTE ONLY): 36 min   Charges:   PT Evaluation $PT Eval Moderate Complexity: 1 Mod          Keller Dixon SPT 07/28/2019   Sanjuana Letters 07/28/2019, 1:52 PM

## 2019-07-28 NOTE — Plan of Care (Signed)
  Problem: Education: Goal: Knowledge of the prescribed therapeutic regimen will improve Outcome: Progressing   Problem: Pain Management: Goal: Pain level will decrease with appropriate interventions Outcome: Progressing   Problem: Skin Integrity: Goal: Will show signs of wound healing Outcome: Progressing   

## 2019-07-28 NOTE — Progress Notes (Signed)
Rehab Admissions Coordinator Note:  Patient was screened by Clois Dupes for appropriateness for an Inpatient Acute Rehab Consult per PT recs. Noted additional surgery Monday and patient lives alone. I do not know if an inpt rehab admit would resolve the need for assistance at home. Please place an inpt  rehab consult if patient would like to be considered for possible admit. Please advise.  Clois Dupes RN MSN 07/28/2019, 2:19 PM  I can be reached at [phone].

## 2019-07-28 NOTE — Progress Notes (Signed)
Orthopaedic Trauma Service Progress Note  Patient ID: Bradley Brown MRN: [id number] DOB/AGE: 08-13-1950 69 y.o.  Subjective:  Resting comfortably on my arrival  No acute issues  Pain tolerable States he is taking more by way of pills   ROS As above  Objective:   VITALS:   Vitals:   07/28/19 0314 07/28/19 0543 07/28/19 0704 07/28/19 0745  BP: 121/73 131/70 117/73 123/72  Pulse: (!) 53 (!) 55 (!) 50 60  Resp: 17 20 17 16   Temp: 97.9 F (36.6 C) 98 F (36.7 C) (!) 97.4 F (36.3 C) 98.3 F (36.8 C)  TempSrc: Oral Oral Oral Oral  SpO2: 99% 100% 100% 99%  Weight:      Height:        Estimated body mass index is 24.62 kg/m as calculated from the following:   Height as of this encounter: 5\' 6"  (1.676 m).   Weight as of this encounter: 69.2 kg.   Intake/Output      05/13 0701 - 05/14 0700 05/14 0701 - 05/15 0700   P.O. 960    I.V. (mL/kg) 1287.6 (18.6)    Total Intake(mL/kg) 2247.6 (32.5)    Urine (mL/kg/hr) 250 (0.2)    Drains 250    Blood 100    Total Output 600    Net +1647.6         Urine Occurrence 600 x      LABS  Results for orders placed or performed during the hospital encounter of 07/26/19 (from the past 24 hour(s))  Magnesium     Status: None   Collection Time: 07/27/19  5:34 PM  Result Value Ref Range   Magnesium 1.9 1.7 - 2.4 mg/dL  Phosphorus     Status: None   Collection Time: 07/27/19  5:34 PM  Result Value Ref Range   Phosphorus 4.0 2.5 - 4.6 mg/dL  CBC     Status: Abnormal   Collection Time: 07/28/19  4:02 AM  Result Value Ref Range   WBC 11.2 (H) 4.0 - 10.5 K/uL   RBC 3.06 (L) 4.22 - 5.81 MIL/uL   Hemoglobin 9.7 (L) 13.0 - 17.0 g/dL   HCT 29.1 (L) 39.0 - 52.0 %   MCV 95.1 80.0 - 100.0 fL   MCH 31.7 26.0 - 34.0 pg   MCHC 33.3 30.0 - 36.0 g/dL   RDW 12.6 11.5 - 15.5 %   Platelets 181 150 - 400 K/uL   nRBC 0.0 0.0 - 0.2 %  Comprehensive metabolic panel      Status: Abnormal   Collection Time: 07/28/19  4:02 AM  Result Value Ref Range   Sodium 136 135 - 145 mmol/L   Potassium 3.9 3.5 - 5.1 mmol/L   Chloride 104 98 - 111 mmol/L   CO2 25 22 - 32 mmol/L   Glucose, Bld 127 (H) 70 - 99 mg/dL   BUN 9 8 - 23 mg/dL   Creatinine, Ser 0.77 0.61 - 1.24 mg/dL   Calcium 8.4 (L) 8.9 - 10.3 mg/dL   Total Protein 5.9 (L) 6.5 - 8.1 g/dL   Albumin 3.0 (L) 3.5 - 5.0 g/dL   AST 24 15 - 41 U/L   ALT 13 0 - 44 U/L   Alkaline Phosphatase 47 38 - 126 U/L   Total Bilirubin 0.6 0.3 - 1.2  mg/dL   GFR calc non Af Amer >60 >60 mL/min   GFR calc Af Amer >60 >60 mL/min   Anion gap 7 5 - 15     PHYSICAL EXAM:   Gen: awake, alert, comfortable appearing  Lungs: unlabored Cardiac: regular Abd: soft NT, +BS Ext:   Left Upper Extremity    LAS fitting well   Ext warm    Good color distally    Radial, ulnar, median nv motor and sensory functions intact   AIN, PIN motor intact   Swelling well controlled   No pain with passive stretch of digits    Left Lower Extremity    Ex fix intact   pinsites and dressings look good   VAC functioning well    Good seal   Ext warm    + DP pulse   EHL, FHL, lesser toe motor intact   Ankle flexion, extension, inversion and eversion intact     Assessment/Plan: 1 Day Post-Op   Active Problems:   Tibial plateau fracture   Closed bicondylar fracture of left tibial plateau   Anti-infectives (From admission, onward)   Start     Dose/Rate Route Frequency Ordered Stop   07/27/19 1800  ceFAZolin (ANCEF) IVPB 1 g/50 mL premix     1 g 100 mL/hr over 30 Minutes Intravenous Every 6 hours 07/27/19 1716 07/28/19 0842   07/27/19 0600  ceFAZolin (ANCEF) IVPB 2g/100 mL premix     2 g 200 mL/hr over 30 Minutes Intravenous On call to O.R. 07/26/19 2344 07/27/19 1300    .  POD/HD#: 1  69 y/o male s/p fall off truck at work on 07/26/2019 with multiple ortho injuries    -fall off truck    - comminuted L bicondylar tibial  plateau fracture with compartment syndrome s/p ex fix and fasciotomies            NWB L leg  PT/OT  Aggressive ice and elevation   Toe and ankle motion as tolerated   OT for foot plate    - comminuted L radial neck/head fracture s/p L radial head arthroplasty   NWB L UEx  Sling   Splint x 10 days then begin gentle ROM   Ice and elevate  Wrist and hand motion as tolerated                 - Pain management:             scheduled tylenol, robaxin   norco for breakthrough pain    - ABL anemia/Hemodynamics             Monitor    - Medical issues              Nicotine dependence                         Discussed risks of continued nicotine use    No nicotine products (patches, gum, etc)   - DVT/PE prophylaxis:             Lovenox  - ID:              periop abx    - Metabolic Bone Disease:             Will check basic labs--> pending    - Activity:             NWB L UEx and L LEx   - FEN/GI prophylaxis/Foley/Lines:  reg diet  IVF  - Impediments to fracture healing:             Nicotine use   - Dispo:             continue with inpatient care  OR Monday for closure of fasciotomies possible ORIF tibial plateue  TOC consult will need SNF placement--> lives alone     Mearl Latin, PA-C [phone] (C) 07/28/2019, 9:27 AM  [facility] [address] [place] [address] [phone] Collier Bullock (F)

## 2019-07-29 LAB — CALCITRIOL (1,25 DI-OH VIT D): Vit D, 1,25-Dihydroxy: 21.8 pg/mL (ref 19.9–79.3)

## 2019-07-29 NOTE — Progress Notes (Signed)
Occupational Therapy Treatment Patient Details Name: Bradley Brown MRN: [id number] DOB: 01/20/1951 Today's Date: 07/29/2019    History of present illness 69 y.o. male presented to ED 07/26/19 after falling off back of truck resulting in left  tibial plateau fracture, elbow fracture radial head comminuted. S/p external fixation L tibia 07/27/19. PMH none on file.   OT comments  Foot plate fabricated, pt tolerated procedure well.  Pain 2/10.  Will monitor to ensure proper fit, and to provide further instruction.   Follow Up Recommendations  CIR;Supervision/Assistance - 24 hour    Equipment Recommendations       Recommendations for Other Services      Precautions / Restrictions Precautions Precautions: Fall Required Braces or Orthoses: Sling;Splint/Cast Splint/Cast: L ORIF, L UE cast (?) Restrictions Weight Bearing Restrictions: Yes LUE Weight Bearing: Non weight bearing LLE Weight Bearing: Non weight bearing       Mobility Bed Mobility                  Transfers                      Balance                                           ADL either performed or assessed with clinical judgement   ADL                                               Vision       Perception     Praxis      Cognition Arousal/Alertness: Awake/alert Behavior During Therapy: WFL for tasks assessed/performed Overall Cognitive Status: No family/caregiver present to determine baseline cognitive functioning                                 General Comments: Pt with decreased recall of instructions requiring them to be repeated.  He denies LOC or hit to his head during the fall         Exercises Exercises: Other exercises Other Exercises Other Exercises: Foot plate fabricated for Rt foot to prevent plantar flexion contracture.  Pt initially with indication of significant pain with attempts at neutral dorsiflexion, but with  slow stretch was able to tolerate it with pain 2/10.  He tolerated fabrication of foot plate well, and was instructed in it's purpose, wear schedule and how to loosen or adjust it.  Video interpreter utlized at beginning of session to provide explanation, and another interpreter utilized at end of session once foot plate fabricated to provide instruction and ensure no increased pain    Shoulder Instructions       General Comments Video interpreters utilized [place] [phone] and [place] Adjutant [phone]    Pertinent Vitals/ Pain       Pain Assessment: 0-10 Pain Score: 2  Pain Location: LLE Pain Descriptors / Indicators: Grimacing;Guarding Pain Intervention(s): Monitored during session;Limited activity within patient's tolerance  Home Living  Prior Functioning/Environment              Frequency  Min 2X/week        Progress Toward Goals  OT Goals(current goals can now be found in the care plan section)  Progress towards OT goals: Progressing toward goals     Plan Discharge plan remains appropriate    Co-evaluation                 AM-PAC OT "6 Clicks" Daily Activity     Outcome Measure   Help from another person eating meals?: None Help from another person taking care of personal grooming?: A Little Help from another person toileting, which includes using toliet, bedpan, or urinal?: A Little Help from another person bathing (including washing, rinsing, drying)?: A Little Help from another person to put on and taking off regular upper body clothing?: A Little Help from another person to put on and taking off regular lower body clothing?: A Little 6 Click Score: 19    End of Session    OT Visit Diagnosis: Pain;Unsteadiness on feet (R26.81) Pain - Right/Left: Left Pain - part of body: Leg   Activity Tolerance Patient tolerated treatment well   Patient Left in bed;with call bell/phone within reach   Nurse  Communication          Time: 0911-0955 OT Time Calculation (min): 44 min  Charges: OT General Charges $OT Visit: 1 Visit OT Treatments $Self Care/Home Management : 8-22 mins $Orthotics Fit/Training: 23-37 mins  Nilsa Nutting., OTR/L Acute Rehabilitation Services Pager [phone] Office [place], [place] 07/29/2019, 10:07 AM

## 2019-07-29 NOTE — Plan of Care (Signed)
  Problem: Education: Goal: Knowledge of the prescribed therapeutic regimen will improve Outcome: Progressing   Problem: Activity: Goal: Ability to increase mobility will improve Outcome: Progressing   Problem: Physical Regulation: Goal: Postoperative complications will be avoided or minimized Outcome: Progressing   

## 2019-07-29 NOTE — Social Work (Signed)
CSW acknowledging SNF placement consult, pt uninsured. Will follow for post op recs. Unclear at this time if pt will qualify for LOG.    Octavio Graves, MSW, LCSW [facility] Clinical Social Work

## 2019-07-29 NOTE — Progress Notes (Signed)
Occupational Therapy Treatment Patient Details Name: Bradley Brown MRN: [id number] DOB: [birth year] Today's Date: 07/29/2019    History of present illness 69 y.o. male presented to ED 07/26/19 after falling off back of truck resulting in left  tibial plateau fracture, elbow fracture radial head comminuted. S/p external fixation L tibia 07/27/19. PMH none on file.   OT comments  Pt appears to be tolerating foot plate well.  He is able to independently loosen and tighten it, and is now performing frequent active dorsi flexion with foot plate in place.  Skin inspected with no apparent evidence of pressure.  Pt reports no increase in pain directly from the foot plate.   Will continue to follow.   Follow Up Recommendations  CIR;Supervision/Assistance - 24 hour    Equipment Recommendations  3 in 1 bedside commode;Tub/shower bench    Recommendations for Other Services      Precautions / Restrictions Precautions Precautions: Fall Required Braces or Orthoses: Sling;Splint/Cast Splint/Cast: L ORIF, L UE cast (?) Restrictions Weight Bearing Restrictions: Yes LUE Weight Bearing: Non weight bearing LLE Weight Bearing: Non weight bearing       Mobility Bed Mobility                  Transfers                      Balance                                           ADL either performed or assessed with clinical judgement   ADL                                               Vision       Perception     Praxis      Cognition Arousal/Alertness: Awake/alert Behavior During Therapy: WFL for tasks assessed/performed Overall Cognitive Status: No family/caregiver present to determine baseline cognitive functioning                                 General Comments: Pt with decreased recall of instructions requiring them to be repeated.  He denies LOC or hit to his head during the fall         Exercises Exercises:  Other exercises Other Exercises Other Exercises: Foot plate and foot checked.  Pt reports he has had no increase in pain due directly from the foot plate.  He did loosen it a little bit and has been performing active dorsiflexion, however, the foot plate remains in good position.  Foot plate was removed and skin was checked on toes and forefoot.  No evidence or pressure noted.  Pt was able to fasten velcro to ex fix independently     Shoulder Instructions       General Comments Video interpreter utlized [place]    Pertinent Vitals/ Pain       Pain Assessment: 0-10 Pain Score: 2  Pain Location: LLE Pain Descriptors / Indicators: Grimacing;Guarding Pain Intervention(s): Monitored during session  Home Living  Prior Functioning/Environment              Frequency  Min 2X/week        Progress Toward Goals  OT Goals(current goals can now be found in the care plan section)  Progress towards OT goals: Progressing toward goals     Plan Discharge plan remains appropriate    Co-evaluation                 AM-PAC OT "6 Clicks" Daily Activity     Outcome Measure   Help from another person eating meals?: None Help from another person taking care of personal grooming?: A Little Help from another person toileting, which includes using toliet, bedpan, or urinal?: A Little Help from another person bathing (including washing, rinsing, drying)?: A Little Help from another person to put on and taking off regular upper body clothing?: A Little Help from another person to put on and taking off regular lower body clothing?: A Little 6 Click Score: 19    End of Session    OT Visit Diagnosis: Pain;Unsteadiness on feet (R26.81) Pain - Right/Left: Left Pain - part of body: Leg   Activity Tolerance Patient tolerated treatment well   Patient Left in bed;with call bell/phone within reach   Nurse Communication           Time: 1610-9604 OT Time Calculation (min): 10 min  Charges: OT General Charges $OT Visit: 1 Visit OT Treatments $Self Care/Home Management : 8-22 mins $Orthotics Fit/Training: 23-37 mins $Orthotics/Prosthetics Check: 8-22 mins  Nilsa Nutting., OTR/L Acute Rehabilitation Services Pager [phone] Office [place], [place] 07/29/2019, 1:41 PM

## 2019-07-29 NOTE — Progress Notes (Signed)
SPORTS MEDICINE AND JOINT REPLACEMENT  Georgena Spurling, MD    Laurier Nancy, PA-C [address] [place], [place], [place]  [address]                             [phone]   PROGRESS NOTE  Subjective:  negative for Chest Pain  negative for Shortness of Breath  negative for Nausea/Vomiting   negative for Calf Pain  negative for Bowel Movement   Tolerating Diet: yes         Patient reports pain as 5 on 0-10 scale.    Objective: Vital signs in last 24 hours:    Patient Vitals for the past 24 hrs:  BP Temp Temp src Pulse Resp SpO2  07/29/19 0518 119/64 98.8 F (37.1 C) Oral (!) 56 17 98 %  07/28/19 2353 (!) 119/55 98.8 F (37.1 C) Oral 78 14 100 %  07/28/19 2017 125/62 98.7 F (37.1 C) Oral 65 16 98 %  07/28/19 0745 123/72 98.3 F (36.8 C) Oral 60 16 99 %    @flow {1959:LAST@   Intake/Output from previous day:   05/14 0701 - 05/15 0700 In: 960.7 [P.O.:840; I.V.:120.7] Out: 1925 [Urine:1575; Drains:350]   Intake/Output this shift:   No intake/output data recorded.   Intake/Output      05/14 0701 - 05/15 0700 05/15 0701 - 05/16 0700   P.O. 840    I.V. (mL/kg) 120.7 (1.7)    IV Piggyback 0    Total Intake(mL/kg) 960.7 (13.9)    Urine (mL/kg/hr) 1575 (0.9)    Drains 350    Blood     Total Output 1925    Net -964.3            LABORATORY DATA: Recent Labs    07/26/19 1655 07/28/19 0402  WBC 11.2* 11.2*  HGB 13.3 9.7*  HCT 39.7 29.1*  PLT 240 181   Recent Labs    07/26/19 1655 07/28/19 0402  NA 136 136  K 4.2 3.9  CL 104 104  CO2 24 25  BUN 13 9  CREATININE 0.85 0.77  GLUCOSE 96 127*  CALCIUM 8.9 8.4*   No results found for: INR, PROTIME  Examination:  General appearance: alert and cooperative Extremities: extremities normal, atraumatic, no cyanosis or edema  Wound Exam: clean, dry, intact   Drainage:  None: wound tissue dry  Motor Exam: Opposition, Pinch, Quadriceps and Hamstrings Intact  Sensory Exam: Radial, Ulnar, Deep Peroneal and  Tibial normal   Assessment:    2 Days Post-Op  Procedure(s) (LRB): EXTERNAL FIXATION LEFT TIBIA PLATEAU (Left) REPLACE REPAIR LEFT RADIAL HEAD (Left) Application Of Wound Vac (Left) Fasciotomy; lateral aspect (Left)  ADDITIONAL DIAGNOSIS:  Active Problems:   Tibial plateau fracture   Closed bicondylar fracture of left tibial plateau     Plan: Physical Therapy as ordered Non Weight Bearing (NWB)  DVT Prophylaxis:  Lovenox  Patient doing well and resting comfortably in bed. Plan to return to OR for fasciotomy closure and tibial plateau fixation Monday. Will continue to monitor over the weekend.   Friday 07/29/2019, 7:41 AM

## 2019-07-30 NOTE — Progress Notes (Signed)
SPORTS MEDICINE AND JOINT REPLACEMENT  Georgena Spurling, MD    Laurier Nancy, PA-C [address] [place], [place], [place]  [address]                             [phone]   PROGRESS NOTE  Subjective:  negative for Chest Pain  negative for Shortness of Breath  negative for Nausea/Vomiting   negative for Calf Pain  negative for Bowel Movement   Tolerating Diet: yes         Patient reports pain as 3 on 0-10 scale.    Objective: Vital signs in last 24 hours:    Patient Vitals for the past 24 hrs:  BP Temp Temp src Pulse Resp SpO2  07/30/19 0453 117/64 97.9 F (36.6 C) Oral (!) 54 16 98 %  07/29/19 1435 117/60 98.6 F (37 C) Oral 65 17 99 %  07/29/19 0758 116/64 98.1 F (36.7 C) Oral (!) 58 17 99 %    @flow {1959:LAST@   Intake/Output from previous day:   05/15 0701 - 05/16 0700 In: 720 [P.O.:720] Out: 2400 [Urine:2400]   Intake/Output this shift:   05/15 1901 - 05/16 0700 In: -  Out: 1100 [Urine:1100]   Intake/Output      05/15 0701 - 05/16 0700   P.O. 720   Total Intake(mL/kg) 720 (10.4)   Urine (mL/kg/hr) 2400 (1.4)   Total Output 2400   Net -1680          LABORATORY DATA: Recent Labs    07/26/19 1655 07/28/19 0402  WBC 11.2* 11.2*  HGB 13.3 9.7*  HCT 39.7 29.1*  PLT 240 181   Recent Labs    07/26/19 1655 07/28/19 0402  NA 136 136  K 4.2 3.9  CL 104 104  CO2 24 25  BUN 13 9  CREATININE 0.85 0.77  GLUCOSE 96 127*  CALCIUM 8.9 8.4*   No results found for: INR, PROTIME  Examination:  General appearance: alert, cooperative and no distress Extremities: extremities normal, atraumatic, no cyanosis or edema  Wound Exam: clean, dry, intact   Drainage:  None: wound tissue dry  Motor Exam: Quadriceps and Hamstrings Intact  Sensory Exam: Superficial Peroneal, Deep Peroneal and Tibial normal   Assessment:    3 Days Post-Op  Procedure(s) (LRB): EXTERNAL FIXATION LEFT TIBIA PLATEAU (Left) REPLACE REPAIR LEFT RADIAL HEAD (Left) Application  Of Wound Vac (Left) Fasciotomy; lateral aspect (Left)  ADDITIONAL DIAGNOSIS:  Active Problems:   Tibial plateau fracture   Closed bicondylar fracture of left tibial plateau     Plan: Physical Therapy as ordered Non Weight Bearing (NWB)  DVT Prophylaxis:  Lovenox  Patient comfortable at bedside, pain improved since yesterday. Plan to return to OR for fasciotomy closure and tibial plateau fixation Monday. Will continue to monitor over the weekend.   Thursday 07/30/2019, 6:58 AM

## 2019-07-30 NOTE — Anesthesia Preprocedure Evaluation (Addendum)
Anesthesia Evaluation  Patient identified by MRN, date of birth, ID band Patient awake    Reviewed: Allergy & Precautions  Airway Mallampati: II  TM Distance: >3 FB     Dental   Pulmonary Current Smoker,    breath sounds clear to auscultation       Cardiovascular negative cardio ROS   Rhythm:Regular Rate:Normal     Neuro/Psych    GI/Hepatic negative GI ROS, Neg liver ROS,   Endo/Other  negative endocrine ROS  Renal/GU negative Renal ROS     Musculoskeletal   Abdominal   Peds  Hematology   Anesthesia Other Findings   Reproductive/Obstetrics                            Anesthesia Physical Anesthesia Plan  ASA: III  Anesthesia Plan: General   Post-op Pain Management:    Induction: Intravenous  PONV Risk Score and Plan:   Airway Management Planned: Oral ETT  Additional Equipment:   Intra-op Plan:   Post-operative Plan:   Informed Consent:   Plan Discussed with:   Anesthesia Plan Comments:         Anesthesia Quick Evaluation

## 2019-07-31 ENCOUNTER — Inpatient Hospital Stay (HOSPITAL_COMMUNITY): Payer: Medicaid Other

## 2019-07-31 ENCOUNTER — Inpatient Hospital Stay (HOSPITAL_COMMUNITY): Payer: Medicaid Other | Admitting: Anesthesiology

## 2019-07-31 ENCOUNTER — Encounter (HOSPITAL_COMMUNITY)
Admission: EM | Disposition: A | Discharge status: Home / Self Care | Payer: Self-pay | Source: Home / Self Care | Attending: Orthopedic Surgery

## 2019-07-31 ENCOUNTER — Encounter (HOSPITAL_COMMUNITY): Payer: Self-pay | Admitting: Orthopedic Surgery

## 2019-07-31 HISTORY — PX: SECONDARY CLOSURE OF WOUND: SHX6208

## 2019-07-31 HISTORY — PX: ORIF TIBIA PLATEAU: SHX2132

## 2019-07-31 HISTORY — PX: EXTERNAL FIXATOR AND ARCH BAR REMOVAL: SHX5309

## 2019-07-31 SURGERY — OPEN REDUCTION INTERNAL FIXATION (ORIF) TIBIAL PLATEAU
Anesthesia: General | Site: Leg Upper | Laterality: Left

## 2019-07-31 MED ORDER — FENTANYL CITRATE (PF) 250 MCG/5ML IJ SOLN
INTRAMUSCULAR | Status: DC | PRN
  Administered 2019-07-31: 25 ug via INTRAVENOUS
  Administered 2019-07-31 (×5): 50 ug via INTRAVENOUS
  Administered 2019-07-31: 25 ug via INTRAVENOUS
  Administered 2019-07-31 (×2): 50 ug via INTRAVENOUS
  Administered 2019-07-31: 100 ug via INTRAVENOUS
  Administered 2019-07-31: 50 ug via INTRAVENOUS

## 2019-07-31 MED ORDER — FENTANYL CITRATE (PF) 250 MCG/5ML IJ SOLN
INTRAMUSCULAR | Status: AC
  Filled 2019-07-31: qty 5

## 2019-07-31 MED ORDER — 0.9 % SODIUM CHLORIDE (POUR BTL) OPTIME
TOPICAL | Status: DC | PRN
Start: 2019-07-31 — End: 2019-07-31
  Administered 2019-07-31 (×2): 1000 mL

## 2019-07-31 MED ORDER — SUGAMMADEX SODIUM 200 MG/2ML IV SOLN
INTRAVENOUS | Status: DC | PRN
  Administered 2019-07-31: 200 mg via INTRAVENOUS

## 2019-07-31 MED ORDER — PROPOFOL 10 MG/ML IV BOLUS
INTRAVENOUS | Status: AC
  Filled 2019-07-31: qty 20

## 2019-07-31 MED ORDER — CEFAZOLIN SODIUM-DEXTROSE 2-3 GM-%(50ML) IV SOLR
INTRAVENOUS | Status: DC | PRN
  Administered 2019-07-31: 2 g via INTRAVENOUS

## 2019-07-31 MED ORDER — MIDAZOLAM HCL 2 MG/2ML IJ SOLN
INTRAMUSCULAR | Status: AC
  Filled 2019-07-31: qty 2

## 2019-07-31 MED ORDER — LIDOCAINE 2% (20 MG/ML) 5 ML SYRINGE
INTRAMUSCULAR | Status: DC | PRN
  Administered 2019-07-31: 60 mg via INTRAVENOUS

## 2019-07-31 MED ORDER — DEXAMETHASONE SODIUM PHOSPHATE 10 MG/ML IJ SOLN
INTRAMUSCULAR | Status: DC | PRN
  Administered 2019-07-31: 5 mg via INTRAVENOUS

## 2019-07-31 MED ORDER — ESMOLOL HCL 100 MG/10ML IV SOLN
INTRAVENOUS | Status: AC
  Filled 2019-07-31: qty 10

## 2019-07-31 MED ORDER — PHENYLEPHRINE HCL-NACL 10-0.9 MG/250ML-% IV SOLN
INTRAVENOUS | Status: DC | PRN
  Administered 2019-07-31: 20 ug/min via INTRAVENOUS

## 2019-07-31 MED ORDER — PHENYLEPHRINE 40 MCG/ML (10ML) SYRINGE FOR IV PUSH (FOR BLOOD PRESSURE SUPPORT)
PREFILLED_SYRINGE | INTRAVENOUS | Status: DC | PRN
  Administered 2019-07-31 (×2): 80 ug via INTRAVENOUS

## 2019-07-31 MED ORDER — LACTATED RINGERS IV SOLN: INTRAVENOUS | Status: DC | PRN

## 2019-07-31 MED ORDER — ROCURONIUM BROMIDE 10 MG/ML (PF) SYRINGE
PREFILLED_SYRINGE | INTRAVENOUS | Status: DC | PRN
  Administered 2019-07-31 (×2): 50 mg via INTRAVENOUS
  Administered 2019-07-31 (×2): 20 mg via INTRAVENOUS

## 2019-07-31 MED ORDER — ESMOLOL HCL 100 MG/10ML IV SOLN
INTRAVENOUS | Status: DC | PRN
Start: 2019-07-31 — End: 2019-07-31
  Administered 2019-07-31: 20 mg via INTRAVENOUS
  Administered 2019-07-31: 30 mg via INTRAVENOUS

## 2019-07-31 MED ORDER — MIDAZOLAM HCL 2 MG/2ML IJ SOLN
INTRAMUSCULAR | Status: DC | PRN
  Administered 2019-07-31: 2 mg via INTRAVENOUS

## 2019-07-31 MED ORDER — PROPOFOL 10 MG/ML IV BOLUS
INTRAVENOUS | Status: DC | PRN
  Administered 2019-07-31: 100 mg via INTRAVENOUS
  Administered 2019-07-31: 20 mg via INTRAVENOUS

## 2019-07-31 MED ORDER — VITAMIN D 25 MCG (1000 UNIT) PO TABS
2000.0000 [IU] | ORAL_TABLET | Freq: Two times a day (BID) | ORAL | Status: DC
  Administered 2019-07-31 – 2019-08-04 (×8): 2000 [IU] via ORAL
  Filled 2019-07-31 (×8): qty 2

## 2019-07-31 MED ORDER — CEFAZOLIN SODIUM-DEXTROSE 2-4 GM/100ML-% IV SOLN
2.0000 g | Freq: Three times a day (TID) | INTRAVENOUS | Status: AC
  Administered 2019-07-31 – 2019-08-01 (×3): 2 g via INTRAVENOUS
  Filled 2019-07-31 (×3): qty 100

## 2019-07-31 SURGICAL SUPPLY — 101 items
BANDAGE ESMARK 6X9 LF (GAUZE/BANDAGES/DRESSINGS) ×2 IMPLANT
BIT DRILL 100X2.5XANTM LCK (BIT) ×2 IMPLANT
BIT DRILL 2.5X2.75 QC CALB (BIT) ×4 IMPLANT
BIT DRILL CAL (BIT) ×2 IMPLANT
BIT DRL 100X2.5XANTM LCK (BIT) ×2
BLADE CLIPPER SURG (BLADE) IMPLANT
BLADE SURG 10 STRL SS (BLADE) ×8 IMPLANT
BLADE SURG 15 STRL LF DISP TIS (BLADE) ×2 IMPLANT
BLADE SURG 15 STRL SS (BLADE) ×2
BNDG COHESIVE 4X5 TAN STRL (GAUZE/BANDAGES/DRESSINGS) ×4 IMPLANT
BNDG ELASTIC 4X5.8 VLCR STR LF (GAUZE/BANDAGES/DRESSINGS) ×4 IMPLANT
BNDG ELASTIC 6X5.8 VLCR STR LF (GAUZE/BANDAGES/DRESSINGS) ×4 IMPLANT
BNDG ESMARK 6X9 LF (GAUZE/BANDAGES/DRESSINGS) ×4
BNDG GAUZE ELAST 4 BULKY (GAUZE/BANDAGES/DRESSINGS) ×4 IMPLANT
BONE CANC CHIPS 40CC CAN1/2 (Bone Implant) ×4 IMPLANT
BRUSH SCRUB EZ PLAIN DRY (MISCELLANEOUS) ×16 IMPLANT
CANISTER SUCT 3000ML PPV (MISCELLANEOUS) ×4 IMPLANT
CHIPS CANC BONE 40CC CAN1/2 (Bone Implant) ×2 IMPLANT
COVER SURGICAL LIGHT HANDLE (MISCELLANEOUS) ×8 IMPLANT
COVER WAND RF STERILE (DRAPES) ×4 IMPLANT
CUFF TOURN SGL QUICK 34 (TOURNIQUET CUFF) ×2
CUFF TRNQT CYL 34X4.125X (TOURNIQUET CUFF) ×2 IMPLANT
DRAPE C-ARM 42X72 X-RAY (DRAPES) ×4 IMPLANT
DRAPE C-ARMOR (DRAPES) ×8 IMPLANT
DRAPE HALF SHEET 40X57 (DRAPES) IMPLANT
DRAPE INCISE IOBAN 66X45 STRL (DRAPES) ×4 IMPLANT
DRAPE U-SHAPE 47X51 STRL (DRAPES) ×8 IMPLANT
DRILL BIT 2.5MM (BIT) ×2
DRILL BIT CAL (BIT) ×4
DRSG ADAPTIC 3X8 NADH LF (GAUZE/BANDAGES/DRESSINGS) ×4 IMPLANT
DRSG MEPILEX BORDER 4X12 (GAUZE/BANDAGES/DRESSINGS) ×4 IMPLANT
DRSG MEPILEX BORDER 4X8 (GAUZE/BANDAGES/DRESSINGS) ×4 IMPLANT
DRSG PAD ABDOMINAL 8X10 ST (GAUZE/BANDAGES/DRESSINGS) ×8 IMPLANT
ELECT CAUTERY BLADE 6.4 (BLADE) IMPLANT
ELECT REM PT RETURN 9FT ADLT (ELECTROSURGICAL) ×8
ELECTRODE REM PT RTRN 9FT ADLT (ELECTROSURGICAL) ×4 IMPLANT
GAUZE SPONGE 4X4 12PLY STRL (GAUZE/BANDAGES/DRESSINGS) ×4 IMPLANT
GLOVE BIO SURGEON STRL SZ7.5 (GLOVE) ×12 IMPLANT
GLOVE BIO SURGEON STRL SZ8 (GLOVE) ×12 IMPLANT
GLOVE BIOGEL PI IND STRL 7.5 (GLOVE) ×4 IMPLANT
GLOVE BIOGEL PI IND STRL 8 (GLOVE) ×4 IMPLANT
GLOVE BIOGEL PI INDICATOR 7.5 (GLOVE) ×4
GLOVE BIOGEL PI INDICATOR 8 (GLOVE) ×4
GOWN STRL REUS W/ TWL LRG LVL3 (GOWN DISPOSABLE) ×8 IMPLANT
GOWN STRL REUS W/ TWL XL LVL3 (GOWN DISPOSABLE) ×4 IMPLANT
GOWN STRL REUS W/TWL LRG LVL3 (GOWN DISPOSABLE) ×8
GOWN STRL REUS W/TWL XL LVL3 (GOWN DISPOSABLE) ×4
HANDPIECE INTERPULSE COAX TIP (DISPOSABLE)
IMMOBILIZER KNEE 22 UNIV (SOFTGOODS) ×4 IMPLANT
K-WIRE ACE 1.6X6 (WIRE) ×20
KIT BASIN OR (CUSTOM PROCEDURE TRAY) ×8 IMPLANT
KIT TURNOVER KIT B (KITS) ×8 IMPLANT
KWIRE ACE 1.6X6 (WIRE) ×10 IMPLANT
MANIFOLD NEPTUNE II (INSTRUMENTS) IMPLANT
NDL SUT 6 .5 CRC .975X.05 MAYO (NEEDLE) ×2 IMPLANT
NEEDLE MAYO TAPER (NEEDLE) ×2
NS IRRIG 1000ML POUR BTL (IV SOLUTION) ×12 IMPLANT
PACK ORTHO EXTREMITY (CUSTOM PROCEDURE TRAY) ×8 IMPLANT
PAD ARMBOARD 7.5X6 YLW CONV (MISCELLANEOUS) ×16 IMPLANT
PAD CAST 4YDX4 CTTN HI CHSV (CAST SUPPLIES) ×2 IMPLANT
PADDING CAST COTTON 4X4 STRL (CAST SUPPLIES) ×2
PADDING CAST COTTON 6X4 STRL (CAST SUPPLIES) ×4 IMPLANT
PLATE LOCK 11H STD LT PROX TIB (Plate) ×4 IMPLANT
SCREW CORT FT 32X3.5XNONLOCK (Screw) ×2 IMPLANT
SCREW CORTICAL 3.5MM  28MM (Screw) ×2 IMPLANT
SCREW CORTICAL 3.5MM  32MM (Screw) ×2 IMPLANT
SCREW CORTICAL 3.5MM 28MM (Screw) ×2 IMPLANT
SCREW CORTICAL 3.5MM 36MM (Screw) ×4 IMPLANT
SCREW LOCK 3.5X60 DIST TIB (Screw) ×4 IMPLANT
SCREW LOCK 3.5X65 DIST TIB (Screw) ×4 IMPLANT
SCREW LOCK CORT STAR 3.5X65 (Screw) ×12 IMPLANT
SCREW LOCK CORT STAR 3.5X70 (Screw) ×8 IMPLANT
SCREW LP 3.5X70MM (Screw) ×8 IMPLANT
SET HNDPC FAN SPRY TIP SCT (DISPOSABLE) IMPLANT
SPONGE LAP 18X18 RF (DISPOSABLE) ×4 IMPLANT
STAPLER SKIN PROX 35W (STAPLE) ×4 IMPLANT
STOCKINETTE IMPERVIOUS 9X36 MD (GAUZE/BANDAGES/DRESSINGS) IMPLANT
STOCKINETTE IMPERVIOUS LG (DRAPES) ×4 IMPLANT
SUCTION FRAZIER HANDLE 10FR (MISCELLANEOUS) ×2
SUCTION TUBE FRAZIER 10FR DISP (MISCELLANEOUS) ×2 IMPLANT
SUT ETHILON 1 TP 1 60 (SUTURE) ×4 IMPLANT
SUT ETHILON 2 0 PSLX (SUTURE) ×12 IMPLANT
SUT ETHILON 3 0 PS 1 (SUTURE) IMPLANT
SUT PDS AB 2-0 CT1 27 (SUTURE) IMPLANT
SUT PROLENE 0 CT 2 (SUTURE) ×8 IMPLANT
SUT VIC AB 0 CT1 27 (SUTURE) ×2
SUT VIC AB 0 CT1 27XBRD ANBCTR (SUTURE) ×2 IMPLANT
SUT VIC AB 1 CT1 27 (SUTURE)
SUT VIC AB 1 CT1 27XBRD ANBCTR (SUTURE) IMPLANT
SUT VIC AB 1 CT1 36 (SUTURE) ×4 IMPLANT
SUT VIC AB 2-0 CT1 27 (SUTURE)
SUT VIC AB 2-0 CT1 TAPERPNT 27 (SUTURE) IMPLANT
SWAB CULTURE ESWAB REG 1ML (MISCELLANEOUS) IMPLANT
TOWEL GREEN STERILE (TOWEL DISPOSABLE) ×8 IMPLANT
TOWEL GREEN STERILE FF (TOWEL DISPOSABLE) ×8 IMPLANT
TRAY FOLEY MTR SLVR 16FR STAT (SET/KITS/TRAYS/PACK) IMPLANT
TUBE CONNECTING 12'X1/4 (SUCTIONS)
TUBE CONNECTING 12X1/4 (SUCTIONS) IMPLANT
UNDERPAD 30X36 HEAVY ABSORB (UNDERPADS AND DIAPERS) ×4 IMPLANT
WATER STERILE IRR 1000ML POUR (IV SOLUTION) IMPLANT
YANKAUER SUCT BULB TIP NO VENT (SUCTIONS) ×4 IMPLANT

## 2019-07-31 NOTE — Anesthesia Postprocedure Evaluation (Signed)
Anesthesia Post Note  Patient: Bradley Brown  Procedure(s) Performed: SECONDARY CLOSURE OF WOUND (Left ) Open Reduction Internal Fixation (Orif) Tibial Plateau (Left ) External Fixator And Arch Bars Removal (Left Leg Upper)     Anesthesia Post Evaluation  Last Vitals:  Vitals:   07/31/19 1319 07/31/19 1950  BP: 138/76 136/72  Pulse: 62 62  Resp: 16 16  Temp: 36.7 C 36.7 C  SpO2: 99% 97%    Last Pain:  Vitals:   07/31/19 1950  TempSrc: Oral  PainSc:                  Rekha Hobbins

## 2019-07-31 NOTE — Anesthesia Procedure Notes (Signed)
Procedure Name: Intubation Date/Time: 07/31/2019 8:27 AM Performed by: Janace Litten, CRNA Pre-anesthesia Checklist: Patient identified, Emergency Drugs available, Suction available and Patient being monitored Patient Re-evaluated:Patient Re-evaluated prior to induction Oxygen Delivery Method: Circle System Utilized Preoxygenation: Pre-oxygenation with 100% oxygen Induction Type: IV induction Ventilation: Mask ventilation without difficulty Laryngoscope Size: Mac and 4 Grade View: Grade I Tube type: Oral Tube size: 7.5 mm Number of attempts: 1 Airway Equipment and Method: Stylet Placement Confirmation: ETT inserted through vocal cords under direct vision,  positive ETCO2 and breath sounds checked- equal and bilateral Secured at: 21 cm Tube secured with: Tape Dental Injury: Teeth and Oropharynx as per pre-operative assessment

## 2019-07-31 NOTE — Op Note (Signed)
12:09 PM 07/31/2019 07/31/2019  PATIENT:  Bradley Brown  69 y.o. male  [id number]  PRE-OPERATIVE DIAGNOSIS:   1. Left leg compartment syndrome s/p fasciotomies 2. LEFT BICONDYLAR TIBIAL PLATEAU FRACTURE 3. RETAINED EXTERNAL FIXATOR  POST-OPERATIVE DIAGNOSIS:   1. Left leg compartment syndrome s/p fasciotomies 2. LEFT BICONDYLAR TIBIAL PLATEAU FRACTURE 3. RETAINED EXTERNAL FIXATOR 4. MIDBODY TEAR LEFT LATERAL MENISCUS  PROCEDURE:   1. ORIF OF LEFT BICONDYLAR TIBIAL PLATEAU FRACTURE  2. ARTHROTOMY WITH PARTIAL MENISCECTOMY LEFT LATERAL MENISCUS 3. REMOVAL OF EXTERNAL FIXATOR UNDER ANESTHESIA 4. LAYERED CLOSURE OF 20 CM WOUND LATERAL FASCIOTOMY  SURGEON:  Surgeon(s) and Role:    Marcelino Scot, Legrand Como, MD - Primary  PHYSICIAN ASSISTANT: Ainsley Spinner, PA-C  ANESTHESIA:   general  I/O:  Total I/O In: 1200 [I.V.:1200] Out: 450 [Urine:250; Blood:200]  SPECIMEN:  None  TOURNIQUET:  * No tourniquets in log *  DICTATION: .Note written in EPIC  DISPOSITION: PACU  CONDITION: STABLE     BRIEF SUMMARY AND INDICATION FOR PROCEDURE:  Patient is a 69 y.o.-year- old with a tibial plateau fracture and compartment syndrome requiring fasciotomies, treated provisionally with external fixation aggressive ice, elevation, and active motion of the foot and toes to facilitate resolution of soft tissue swelling.  We did discuss with the patient the risks and benefits of surgical treatment including the potential for arthritis, nerve injury, vessel injury, loss of motion, DVT, PE, heart attack, stroke, symptomatic hardware, need for further surgery, and multiple others.  The patient acknowledged these risks and wished to proceed.   BRIEF SUMMARY OF PROCEDURE:  After administration of preoperative antibiotics, the patient was taken to the operating room.  General anesthesia was induced. The lower extremity prepped and draped in usual sterile fashion using a chlorhexidine wash and betadine scrub and  paint. A timeout was performed. We did retain the fixator to protect the neurovascular structures during prepping. Once time-out was held, the leg was isolated from the clamps with towels and the fixator removed. Bradley Brown was used to isolate pin sites from the surgical field, and new gloves obtained by operative staff. I then brought in the radiolucent triangle.  A curvilinear incision was made extending laterally over Gerdy's tubercle. Dissection was carried down where the soft tissues were left intact to the lateral plateau and rim.  I did incise the retinaculum proximal to the tibial plateau, and then going along inside the retinaculum performed a submeniscal arthrotomy, releasing the coronary ligament along its insertion onto the tibia. Zero prolene suture was used to reflect this and inspect the meniscus and joint surface. The joint was irrigated thoroughly and this revealed the lateral meniscus tear in the midbody.  I was able to use a series of biters as well as a 15 blade to sharply debride the torn area back to a stable and healthy appearing edge.  The joint surface was markedly depressed.  We then released some of the anterior extensors to enable the plate to fit along the proximal shaft. The fracture site booked open, allowing introduction of a series of tamps and with my assistant pulling traction, I was able to elevate the articular surface in sequential fashion while watching it through the arthrotomy. Once I had restored appropriate Height to the lateral plateau, the bone defect was grafted with cancellous chips.  I then placed the plate laterally and used the [employer] clamp to apply a compressive force across the joint line. This reduced the widened plateau back to the appropriate size.  At this point, we placed standard fixation in the proximal row of the plate followed by locked fixation.  The defect in the metaphysis was filled with cancellous bone and tamped into place. This was followed  by additional fixation within the shaft. My assistant was careful to control alignment throughout by using traction and bending forces. He also assited with retraction. All wounds were irrigated thoroughly. Final AP and LAT fluoro images showed restoration of alignment, reduction, and varus/ valgus stability on stress view.   Prior to closure, I turned my attention to the 20 cm open lateral leg wound from the fasciotomies. Using vicryl and nylon a standard layered closure was performed using interval placement of retention sutures. Once more, wound was irrigated and then a standard layered closure performed, 0 Vicryl, 2-0 Vicryl, and 3-0 nylon for the skin and today's surgical wounds.  Sterile gently compressive dressing was applied and in knee immobilizer.  The patient was taken to the PACU in stable condition.   PROGNOSIS: The patient will be transitioned into a hinged knee brace with unrestricted range of motion and this will begin immediately.  Orders entered for nonweightbearing on the operative extremity, pharmacologic DVT prophylaxis, and mobilization with PT and OT. After discharge, we will plan to see the patient back in about 2 weeks for removal of sutures and we will continue to follow throughout the hospital stay.         Doralee Albino. Carola Frost, M.D.

## 2019-07-31 NOTE — Transfer of Care (Signed)
Immediate Anesthesia Transfer of Care Note  Patient: Bradley Brown  Procedure(s) Performed: SECONDARY CLOSURE OF WOUND (Left ) Open Reduction Internal Fixation (Orif) Tibial Plateau (Left ) External Fixator And Arch Bars Removal (Left Leg Upper)  Patient Location: PACU  Anesthesia Type:General  Level of Consciousness: drowsy and responds to stimulation  Airway & Oxygen Therapy: Patient Spontanous Breathing  Post-op Assessment: Report given to RN and Post -op Vital signs reviewed and stable  Post vital signs: Reviewed and stable  Last Vitals:  Vitals Value Taken Time  BP 175/83 07/31/19 1139  Temp    Pulse 66 07/31/19 1142  Resp 10 07/31/19 1142  SpO2 100 % 07/31/19 1142  Vitals shown include unvalidated device data.  Last Pain:  Vitals:   07/31/19 0421  TempSrc: Oral  PainSc:       Patients Stated Pain Goal: 0 (07/30/19 1947)  Complications: No apparent anesthesia complications

## 2019-07-31 NOTE — Plan of Care (Signed)
°  Problem: Education: °Goal: Knowledge of the prescribed therapeutic regimen will improve °Outcome: Progressing °  °Problem: Activity: °Goal: Ability to increase mobility will improve °Outcome: Progressing °  °Problem: Physical Regulation: °Goal: Postoperative complications will be avoided or minimized °Outcome: Progressing °  °Problem: Pain Management: °Goal: Pain level will decrease with appropriate interventions °Outcome: Progressing °  °Problem: Skin Integrity: °Goal: Will show signs of wound healing °Outcome: Progressing °  °Problem: Education: °Goal: Knowledge of General Education information will improve °Description: Including pain rating scale, medication(s)/side effects and non-pharmacologic comfort measures °Outcome: Progressing °  °Problem: Health Behavior/Discharge Planning: °Goal: Ability to manage health-related needs will improve °Outcome: Progressing °  °Problem: Clinical Measurements: °Goal: Ability to maintain clinical measurements within normal limits will improve °Outcome: Progressing °Goal: Will remain free from infection °Outcome: Progressing °Goal: Diagnostic test results will improve °Outcome: Progressing °Goal: Respiratory complications will improve °Outcome: Progressing °Goal: Cardiovascular complication will be avoided °Outcome: Progressing °  °Problem: Activity: °Goal: Risk for activity intolerance will decrease °Outcome: Progressing °  °Problem: Nutrition: °Goal: Adequate nutrition will be maintained °Outcome: Progressing °  °Problem: Coping: °Goal: Level of anxiety will decrease °Outcome: Progressing °  °Problem: Elimination: °Goal: Will not experience complications related to bowel motility °Outcome: Progressing °Goal: Will not experience complications related to urinary retention °Outcome: Progressing °  °Problem: Pain Managment: °Goal: General experience of comfort will improve °Outcome: Progressing °  °Problem: Safety: °Goal: Ability to remain free from injury will  improve °Outcome: Progressing °  °Problem: Skin Integrity: °Goal: Risk for impaired skin integrity will decrease °Outcome: Progressing °  °

## 2019-07-31 NOTE — Op Note (Addendum)
12/08/2018  6:19 PM  PATIENT:  Bradley Brown  69 y.o.  PRE-OPERATIVE DIAGNOSIS:   1. Left bicondylar tibial plateau fracture 2. Left comminuted radial neck fracture  POST-OPERATIVE DIAGNOSIS:   1. Left bicondylar tibial plateau fracture 2. Left leg compartment syndrome 3.  Left comminuted radial neck fracture  PROCEDURE:  Procedure(s): 1. CLOSED REDUCTION OF LEFT TIBIAL PLATEAU 2. APPLICATION OF SPANNING EXTERNAL FIXATOR LEFT KNEE 3. ASPIRATION OF LEFT KNEE JOINT 4. MEASUREMENT OF ANTERIOR COMPARTMENT PRESSURE WITH STRYKER DEVICE, 65 MMHG 5. LEFT LEG COMPARTMENT FASCIOTOMIES, ANTERIOR, LATERAL, AND SUPERFICIAL POSTERIOR 6. LEFT RADIAL HEAD ARTHROPLASTY 7. REPAIR OF LEFT ULNAR COLLATERAL LIGAMENT AND ELBOW DISLOCATION 8. APPLICATION OF LARGE WOUND VAC  SURGEON:  Surgeon(s) and Role:    * Altamese [place], MD - Primary  PHYSICIAN ASSISTANT: Ainsley Spinner, PA-C  ANESTHESIA:   general  EBL:  minimal   BLOOD ADMINISTERED:none  DRAINS: none   LOCAL MEDICATIONS USED:  NONE  SPECIMEN:  No Specimen  DISPOSITION OF SPECIMEN:  N/A  COUNTS:  YES  TOURNIQUET:  * No tourniquets in log *  DICTATION: .Note written in EPIC  PLAN OF CARE: Admit to inpatient   PATIENT DISPOSITION:  PACU - hemodynamically stable.   Delay start of Pharmacological VTE agent (>24hrs) due to surgical blood loss or risk of bleeding: noBRIEF SUMMARY OF   INDICATIONS FOR PROCEDURE:  Bradley Brown is a 69 y.o. -year-old who sustained left knee injury with severe impacted and displaced fractures of the tibial plateau. The patient was seen and evaluated preoperatively where substantial swelling and pain with passive stretch was noted. I discussed with him the possibility of compartment release as well as spanning external fixation with delayed definitive internal fixation.  Risks discussed include pin tract infection, the need for staged procedures, heart attack, stroke, anesthetic reaction, possible need for  skin grafting, malunion, nonunion, infection, and multiple others.  Specifically, we discussed potential for limb loss in the event of deep infection, particularly given poorly controlled diabetes and 2 pack-a-day smoking.  BRIEF SUMMARY OF PROCEDURE:  After administration of preoperative antibiotics, the patient was taken to the operating room where general anesthesia was induced. The left lower extremity was prepped and draped in usual sterile fashion while carefully holding traction.  After time-out, C-arm was brought in.  Appropriate starting points were identified on the femur and tibia using a clamp for proper spacing and orientation.  Small stab incisions were made.  Tonsil clamp used to spread down to bone.  The pins were advanced to the far cortex.  These were then checked with lateral flouro images to confirm appropriate position just into the far cortex.  After Applying and securing clamps on the femur and the tibia, two bars were placed and a closed manipulation performed of both the tibial plateau and the shaft area restoring appropriate length, alignment, and rotation.  Once this was confirmed with AP and lateral C-arm images, my assistant terminally tightened all of the clamps. We dressed the pin sites with Kerlix and applied a sterile gently compressive dressing from foot to thigh.   Because of the continued swelling and tightness of the compartments, but our desire not to perform fasciotomy unless necessary given the patient's alert and coherent state and his underlying medical conditions, we used the manometer and Stryker device to check the anterior compartment pressure.  He registered 65 mmHg.  His diastolic pressure was 70 preoperatively.  Consequently, we chose to proceed with 4 compartment fasciotomy.  We then turned  our attention to the large knee hemarthrosis, which contributed to soft tissue swelling and internal pressure on the skin. Using an 18 gauge needle and 30 cc  syringe, 60 cc of sanguinous fluid was aspirated from the knee.  The location of the fibular head was marked proximally and the tip of the fibula distally.  A large 20 cm incision was then made in line with these 2 points in the mid aspect of the leg.  This was just down to the Fascia.  Surprisingly the posterior superficial compartment was protruding lateral and anterior to the edge of the lateral compartment itself. Consequently, I proceeded with release of the superficial posterior compartment and then continued with elevation of the subcutaneous tissue along the fascial plane to identify the anterior intermuscular septum and the perforating vessels. The anterior compartment was then slit longitudinally with a knife away from the septum.  Septum was palpated with the tip of the scissors in the posterior direction to confirm appropriate placement given some hemorrhage within the tissues.  With the scissor tips curved away from the superficial peroneal nerve, the anterior compartment fascia was then released both proximally and distally for approximately 30 cm.  There was significant muscle extrusion as soon as the compartment was released.  Next, the lateral compartment was released here again curving the scissor tips posterior and away from the superficial peroneal nerve and this was also released distally and proximally for a near 30-cm release.  There was hemorrhage here but not as severe as the anterior and posterior compartments. The soleus insertion was released along the posterolateral edge exposing the deep compartment.  The deep compartment was then incised under direct visualization.  There was not a significant amount of hematoma within the deep compartment. The wounds were all irrigated thoroughly and then wound VACs applied.  This was a 25 x 8 cm VAC on the lateral side.  Sterile gently compressive dressings were then applied from foot to ankle using two Ace wraps. Bradley Morita,  PA-C, assisted me throughout and his assistance was absolutely necessary as one of [place] had to produce and control the reduction during clamp adjustments.  Attention was then turned to the left upper extremity which was then separately prepped and draped in usual sterile fashion using chlorhexidine scrub and Betadine scrub and paint.  Tourniquet was placed above the upper arm.  Incision was marked and the arm exsanguinated with an Esmarch bandage and then the tourniquet inflated to 250 mmHg with exact total time indicated in anesthesia record.  A Kocher approach was made to the radiocapitellar joint. Once incising through the muscle, the deep tissues were divided in a way that would allow for complete repair of the elbow capsule and lateral ulnar collateral ligament, which is avulsed in dislocation, back to the appropriate spot on the the lateral epicondyle.  The arthrotomy was performed with the forearm fully pronated to protect the posterior interosseous nerve.  At all times, we were cognizant of protecting the posterior interosseous nerve with gentle retraction and pronation whenever feasible or at risk.  The head was evaluated and measured.  The trial was used to visualize the appropriate size cut.  This was made with a small oscillating saw while protecting the soft tissues with well placed baby Hohmann.  After removal, the joint was then fully evaluated.  There was minor cartilage injury to the capitellum and a fracture of the tip of the coronoid consistent with dislocation. Multiple joint fragments were removed.  The radial  head was reconstructed on the back table and sized at 22.  The 20, however, gave the best fit and coverage of the capitellum without overstuffing the joint and consequently was selected. The radial neck fracture extended rather distal. I sequentially reamed and broached the radius up to a #9.  Trials were checked under fluoro with excellent stability and motion.  Good  alignment across the joint.  The trial components were then removed and attention turned to the coronoid.  Here two #2 FiberWire sutures were run through the small fragments of the coronoid and then a separate incision made directly over the ulna.  The small caliber drills were then passed through the posterior or dorsal aspect of the ulna into the footprint of the coronoid fracture.  The suture passer was then used to retrieve the ends of both sutures, which were then later tied over a bone bridge for the ulna.  Once these sutures were passed, attention was turned to the radial head.  Here with appropriate retraction, the broach was seated in the head, it was engaged with a screw, and then the coronoid fragments pulled down and tied and secured.  The joint was irrigated and evaluated multiple times throughout and immediately before implantation, had excellent stability.  C-arm was brought in to confirm this using AP and lateral images.  Lastly, the elbow was brought back up into 90 degrees.  A suture anchor was placed in the lateral epicondylar ridge and engaged.  The MaxBraid suture was then used to repair the stump of the avulsed lateral ulnar collateral ligament using the imbrication type stitch and this was then passed proximally to reinforce this repair.  It should be noted that prior to placing the suture anchor, I did abrade and roughen the cortex there with a rasp.  The arthrotomy was then sewn carefully with #1 Vicryl with the forearm again in pronation and just getting the edge with full thickness of the capsule and then 0 Vicryl for the deep subcu, 2-0 Vicryl and 3-0 nylon for the skin.  Sterile gently compressive dressing was applied and then a posterior and strut splint.  He was then taken to PACU in stable condition.  PROGNOSIS:  The patient will need to return to the OR in several days for repeat washout and possible advancement toward closure of the wounds, maybe  even definitive repair if soft tissues allow. The magnitude of articular and soft tissue injury also increases the risks of arthritis and soft tissue complications including infection. With respect to the elbow, There is potential for loosening with the radial stem given the associated fracture of the radial neck and also of heterotopic bone.  Doralee Albino. Carola Frost, M.D.

## 2019-07-31 NOTE — Progress Notes (Signed)
Orthopedic Tech Progress Note Patient Details:  Bradley Brown [birth year] [id number] Called in order to [facility] for ROM KNEE BRACE Patient ID: [name], male   DOB: 05-03-1950, 69 y.o.   MRN: [id number]   [name] 07/31/2019, 2:52 PM

## 2019-08-01 ENCOUNTER — Encounter: Payer: Self-pay | Admitting: *Deleted

## 2019-08-01 DIAGNOSIS — E559 Vitamin D deficiency, unspecified: Secondary | ICD-10-CM | POA: Diagnosis present

## 2019-08-01 DIAGNOSIS — S52132A Displaced fracture of neck of left radius, initial encounter for closed fracture: Secondary | ICD-10-CM

## 2019-08-01 HISTORY — DX: Displaced fracture of neck of left radius, initial encounter for closed fracture: S52.132A

## 2019-08-01 HISTORY — DX: Vitamin D deficiency, unspecified: E55.9

## 2019-08-01 LAB — CBC
HCT: 27.5 % — ABNORMAL LOW (ref 39.0–52.0)
Hemoglobin: 9.1 g/dL — ABNORMAL LOW (ref 13.0–17.0)
MCH: 31.5 pg (ref 26.0–34.0)
MCHC: 33.1 g/dL (ref 30.0–36.0)
MCV: 95.2 fL (ref 80.0–100.0)
Platelets: 245 10*3/uL (ref 150–400)
RBC: 2.89 MIL/uL — ABNORMAL LOW (ref 4.22–5.81)
RDW: 12.4 % (ref 11.5–15.5)
WBC: 10 10*3/uL (ref 4.0–10.5)
nRBC: 0 % (ref 0.0–0.2)

## 2019-08-01 MED ORDER — ASCORBIC ACID 500 MG PO TABS
1000.0000 mg | ORAL_TABLET | Freq: Every day | ORAL | Status: DC
  Administered 2019-08-02 – 2019-08-04 (×3): 1000 mg via ORAL
  Filled 2019-08-01 (×3): qty 2

## 2019-08-01 NOTE — Plan of Care (Signed)
  Problem: Education: Goal: Knowledge of the prescribed therapeutic regimen will improve Outcome: Progressing   Problem: Activity: Goal: Ability to increase mobility will improve Outcome: Progressing   Problem: Education: Goal: Knowledge of General Education information will improve Description: Including pain rating scale, medication(s)/side effects and non-pharmacologic comfort measures Outcome: Progressing   Problem: Activity: Goal: Risk for activity intolerance will decrease Outcome: Progressing

## 2019-08-01 NOTE — TOC Initial Note (Addendum)
Transition of Care [facility]) - Initial/Assessment Note    Patient Details  Name: Bradley Brown MRN: [id number] Date of Birth: [birth year]  [facility] CM/SW Contact:    Epifanio Lesches, RN Phone Number: [phone] 08/01/2019, 12:17 PM  Clinical Narrative:    Admitted 2/2 falling out of a truck          - s/p SECONDARY CLOSURE OF WOUND (Left ) Open Reduction Internal Fixation (Orif) Tibial Plateau (Left ) External Fixator And Arch Bars Removal (Left Leg Upper), 5/17    Pt from home alone. Speaks little [place]. Pt without SS#, undocumented. States has been in [place] since 2003. No PCP. No health insurance. 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 [employer] / Engineer, structural .[employer]..SIGNATURE FOR [facility] APP completed, plan to submit 08/15/2019 because pt with income.  CIR following for potential admit.   [facility]   [phone] [address] [place] [address]    Next Steps: Follow up on 08/30/2019    Instructions: 2:30 pm, Dr. Hoy Register    Post hospital f/u and to establish PCP. [facility]  Will be able to assist with medication needs, pt with insurance.   TOC team following for needs.   Expected Discharge Plan: Home/Self Care Barriers to Discharge: Undocumented, Inadequate or no insurance   Patient Goals and CMS Choice        Expected Discharge Plan and Services Expected Discharge Plan: Home/Self Care                                              Prior Living Arrangements/Services                       Activities of Daily Living Home Assistive Devices/Equipment: None ADL Screening (condition at time of admission) Patient's cognitive ability adequate to safely complete daily activities?: Yes Is the patient deaf or have difficulty hearing?: No Does the patient have difficulty seeing, even when wearing glasses/contacts?: No Does the patient have  difficulty concentrating, remembering, or making decisions?: No Patient able to express need for assistance with ADLs?: Yes Does the patient have difficulty dressing or bathing?: No Independently performs ADLs?: Yes (appropriate for developmental age) Does the patient have difficulty walking or climbing stairs?: No Weakness of Legs: None Weakness of Arms/Hands: None  Permission Sought/Granted                  Emotional Assessment              Admission diagnosis:  Tibial plateau fracture [S82.143A] Fall, initial encounter [W19.XXXA] Closed comminuted fracture of proximal end of left radius, initial encounter [S52.182A] Closed fracture of proximal end of left tibia, unspecified fracture morphology, initial encounter [S82.102A] Closed bicondylar fracture of left tibial plateau [S82.142A] Patient Active Problem List   Diagnosis Date Noted  . Closed bicondylar fracture of left tibial plateau 07/27/2019  . Tibial plateau fracture 07/26/2019   PCP:  Patient, No Pcp Per Pharmacy:   [id number] [place], [place] - [address] AT [facility] [address] [place] [address] Phone: [phone] Fax: [phone]     Social Determinants of Health (SDOH) Interventions    Readmission Risk Interventions No flowsheet data found.

## 2019-08-01 NOTE — Progress Notes (Signed)
Physical Therapy Treatment Patient Details Name: Bradley Brown MRN: [id number] DOB: 22-Aug-1950 Today's Date: 08/01/2019    History of Present Illness 69 y.o. male presented to ED 07/26/19 after falling off back of truck resulting in left  tibial plateau fracture, elbow fracture radial head comminuted. S/p external fixation L tibia 07/27/19. PMH none on file.    PT Comments    Pt agreeable to PT/OT session but quite agitated with mobility and with pain distal LLE that he reports is worse than he had with ex fix. Pt practiced sit>stand with mod A +2 with assist to keep LLE NWB. Pt transferred to recliner with quad cane and mod A +2, pivoting on RLE, vc's for upright posture. Continue with CIR rec. PT will continue to follow.    Follow Up Recommendations  CIR     Equipment Recommendations  Wheelchair (measurements PT);Wheelchair cushion (measurements PT);3in1 (PT)    Recommendations for Other Services       Precautions / Restrictions Precautions Precautions: Fall Required Braces or Orthoses: Sling;Splint/Cast Splint/Cast: L ORIF, LUE splint, hinged knee brace LLE Restrictions Weight Bearing Restrictions: Yes LUE Weight Bearing: Non weight bearing LLE Weight Bearing: Non weight bearing    Mobility  Bed Mobility Overal bed mobility: Needs Assistance Bed Mobility: Supine to Sit     Supine to sit: Min assist     General bed mobility comments: minA for LLE management  Transfers Overall transfer level: Needs assistance Equipment used: Quad cane Transfers: Risk manager;Sit to/from Stand Sit to Stand: Mod assist;+2 physical assistance;+2 safety/equipment Stand pivot transfers: Mod assist;+2 physical assistance;+2 safety/equipment       General transfer comment: 1 assist for LLE NWB, 2nd assist for trunk support and balance with transfer to recliner towards right side pt stabilizing through RUE on quad cane, pivoting RLE;cues for upright posture, pt with forward  flexed trunk requiring assistance to progress upright  Ambulation/Gait             General Gait Details: unable with LUE and LLE NWB   Stairs             Wheelchair Mobility    Modified Rankin (Stroke Patients Only)       Balance Overall balance assessment: Needs assistance Sitting-balance support: Single extremity supported Sitting balance-Leahy Scale: Fair     Standing balance support: Single extremity supported Standing balance-Leahy Scale: Poor Standing balance comment: use of cane to stand pt required maxA for static standing                            Cognition Arousal/Alertness: Awake/alert Behavior During Therapy: WFL for tasks assessed/performed Overall Cognitive Status: No family/caregiver present to determine baseline cognitive functioning                                 General Comments: pt seems to be in some denial re: what it will take to mobilize safely at home      Exercises General Exercises - Lower Extremity Ankle Circles/Pumps: AROM;Left;10 reps;Seated    General Comments General comments (skin integrity, edema, etc.): video interpreter used for session      Pertinent Vitals/Pain Pain Assessment: Faces Pain Score: 2  Faces Pain Scale: Hurts even more Pain Location: LLE 2 at rest faces indicate up to 6 with movement Pain Descriptors / Indicators: Grimacing;Guarding Pain Intervention(s): Limited activity within patient's tolerance;Monitored during session;Premedicated before  session    Home Living                      Prior Function            PT Goals (current goals can now be found in the care plan section) Acute Rehab PT Goals Patient Stated Goal: to go home  PT Goal Formulation: With patient Time For Goal Achievement: 08/11/19 Potential to Achieve Goals: Good Progress towards PT goals: Progressing toward goals    Frequency    Min 5X/week      PT Plan Current plan remains  appropriate    Co-evaluation PT/OT/SLP Co-Evaluation/Treatment: Yes Reason for Co-Treatment: Complexity of the patient's impairments (multi-system involvement);For patient/therapist safety;Necessary to address cognition/behavior during functional activity PT goals addressed during session: Mobility/safety with mobility;Balance;Proper use of DME OT goals addressed during session: ADL's and self-care      AM-PAC PT "6 Clicks" Mobility   Outcome Measure  Help needed turning from your back to your side while in a flat bed without using bedrails?: A Little Help needed moving from lying on your back to sitting on the side of a flat bed without using bedrails?: A Little Help needed moving to and from a bed to a chair (including a wheelchair)?: A Little Help needed standing up from a chair using your arms (e.g., wheelchair or bedside chair)?: Total Help needed to walk in hospital room?: Total Help needed climbing 3-5 steps with a railing? : Total 6 Click Score: 12    End of Session Equipment Utilized During Treatment: Gait belt Activity Tolerance: Patient tolerated treatment well Patient left: in chair;with call bell/phone within reach;with chair alarm set Nurse Communication: Mobility status PT Visit Diagnosis: Other abnormalities of gait and mobility (R26.89);Pain Pain - Right/Left: Left Pain - part of body: Arm;Leg     Time: 1638-4665 PT Time Calculation (min) (ACUTE ONLY): 31 min  Charges:  $Therapeutic Activity: 8-22 mins                     Lyanne Co, PT  Acute Rehab Services  Pager [phone] Office [phone]    Lawana Chambers Sterlin Knightly 08/01/2019, 1:55 PM

## 2019-08-01 NOTE — Progress Notes (Signed)
Occupational Therapy Treatment Patient Details Name: Bradley Brown MRN: [id number] DOB: [birth year] Today's Date: 08/01/2019    History of present illness 69 y.o. male presented to ED 07/26/19 after falling off back of truck resulting in left  tibial plateau fracture, elbow fracture radial head comminuted. S/p external fixation L tibia 07/27/19. PMH none on file.   OT comments  Pt in bed upon arrival, pt agreeable to OT/PT session. Pt required minA for bed mobility, he required increased time and effort to progress to EOB. Pt required modA+2 for sit<>stand and stand-pivot transfer to recliner with use of quad cane and assist from therapist for NWB LLE and additional assist for stability and trunk support. Pt will continue to benefit from skilled OT services to maximize safety and independence with ADL/IADL and functional mobility. Will continue to follow acutely and progress as tolerated.   [employer] teleinterpreter utilized throughout session: Oswaldo Done #[id number]   Follow Up Recommendations  CIR;Supervision/Assistance - 24 hour    Equipment Recommendations  3 in 1 bedside commode;Tub/shower bench    Recommendations for Other Services Rehab consult    Precautions / Restrictions Precautions Precautions: Fall Required Braces or Orthoses: Sling;Splint/Cast Splint/Cast: L ORIF, LUE splint, hinged knee brace LLE Restrictions Weight Bearing Restrictions: Yes LUE Weight Bearing: Non weight bearing LLE Weight Bearing: Non weight bearing       Mobility Bed Mobility Overal bed mobility: Needs Assistance Bed Mobility: Supine to Sit     Supine to sit: Min assist     General bed mobility comments: minA for LLE management  Transfers Overall transfer level: Needs assistance Equipment used: 2 person hand held assist;Quad cane Transfers: Stand Pivot Transfers   Stand pivot transfers: Mod assist;+2 physical assistance;+2 safety/equipment       General transfer comment: 1 assist for LLE NWB,  2nd assist for trunk support and balance with transfer to recliner towards right side pt stabilizing through RUE on quad cane, pivoting RLE;cues for upright posture, pt with forward flexed trunk requiring assistance to progress upright    Balance Overall balance assessment: Needs assistance Sitting-balance support: Single extremity supported Sitting balance-Leahy Scale: Fair     Standing balance support: Single extremity supported Standing balance-Leahy Scale: Poor Standing balance comment: use of cane to stand pt required maxA for static standing                           ADL either performed or assessed with clinical judgement   ADL Overall ADL's : Needs assistance/impaired Eating/Feeding: Set up;Sitting   Grooming: Set up;Sitting               Lower Body Dressing: Maximal assistance;Sit to/from stand   Toilet Transfer: +2 for physical assistance;+2 for safety/equipment;Stand-pivot;BSC;Moderate assistance Toilet Transfer Details (indicate cue type and reason): with use of quad cane  Toileting- Clothing Manipulation and Hygiene: Maximal assistance;Sit to/from stand       Functional mobility during ADLs: Moderate assistance;+2 for physical assistance;+2 for safety/equipment General ADL Comments: pt limited by pain in LLE, pt unable to tolerate any knee ROM     Vision       Perception     Praxis      Cognition Arousal/Alertness: Awake/alert Behavior During Therapy: WFL for tasks assessed/performed Overall Cognitive Status: No family/caregiver present to determine baseline cognitive functioning  General Comments: pt appeared slightly agitated during session, RN brought pt a beer during session, pt declined therapist offer to open beer for him at that moment        Exercises     Shoulder Instructions       General Comments video interpreter throughout the session Cori Razor #[id number]    Pertinent Vitals/  Pain       Pain Assessment: 0-10 Pain Score: 2  Faces Pain Scale: Hurts even more Pain Location: LLE 2 at rest faces indicate up to 6 with movement Pain Descriptors / Indicators: Grimacing;Guarding Pain Intervention(s): Monitored during session;Limited activity within patient's tolerance  Home Living                                          Prior Functioning/Environment              Frequency  Min 2X/week        Progress Toward Goals  OT Goals(current goals can now be found in the care plan section)  Progress towards OT goals: Progressing toward goals  Acute Rehab OT Goals Patient Stated Goal: to go home  OT Goal Formulation: With patient Time For Goal Achievement: 08/11/19 Potential to Achieve Goals: Good ADL Goals Pt Will Perform Upper Body Bathing: with set-up;with supervision;sitting Pt Will Perform Lower Body Bathing: with supervision;bed level;sitting/lateral leans Pt Will Perform Upper Body Dressing: with set-up;with supervision;sitting Pt Will Perform Lower Body Dressing: with set-up;with supervision;with adaptive equipment;sitting/lateral leans;bed level Pt Will Transfer to Toilet: with transfer board;with min guard assist;squat pivot transfer;bedside commode Pt Will Perform Toileting - Clothing Manipulation and hygiene: with supervision;sitting/lateral leans  Plan Discharge plan remains appropriate    Co-evaluation    PT/OT/SLP Co-Evaluation/Treatment: Yes Reason for Co-Treatment: Complexity of the patient's impairments (multi-system involvement);For patient/therapist safety;To address functional/ADL transfers   OT goals addressed during session: ADL's and self-care      AM-PAC OT "6 Clicks" Daily Activity     Outcome Measure   Help from another person eating meals?: A Little Help from another person taking care of personal grooming?: A Little Help from another person toileting, which includes using toliet, bedpan, or urinal?: A  Lot Help from another person bathing (including washing, rinsing, drying)?: A Little Help from another person to put on and taking off regular upper body clothing?: A Little Help from another person to put on and taking off regular lower body clothing?: A Lot 6 Click Score: 16    End of Session Equipment Utilized During Treatment: Gait belt;Other (comment)(quad cane)  OT Visit Diagnosis: Pain;Unsteadiness on feet (R26.81) Pain - Right/Left: Left Pain - part of body: Leg   Activity Tolerance Patient tolerated treatment well;Patient limited by pain   Patient Left with call bell/phone within reach;in chair;with chair alarm set   Nurse Communication Mobility status        Time: 5277-8242 OT Time Calculation (min): 32 min  Charges: OT General Charges $OT Visit: 1 Visit OT Treatments $Self Care/Home Management : 8-22 mins  Helene Kelp OTR/L Acute Rehabilitation Services Office: [place] 08/01/2019, 1:37 PM

## 2019-08-01 NOTE — Progress Notes (Signed)
Orthopaedic Trauma Service Progress Note  Patient ID: Bradley Brown MRN: [id number] DOB/AGE: 1950/07/14 69 y.o.  Subjective:  Sleeping  Comfortable No acute issues  Therapy recommending CIR    ROS As above  Objective:   VITALS:   Vitals:   08/01/19 0737 08/01/19 1437 08/01/19 1437 08/01/19 1949  BP: 117/62 (!) 114/57 (!) 113/50 108/64  Pulse: (!) 53 (!) 55 (!) 59 (!) 56  Resp: 14  15 14   Temp: 98.4 F (36.9 C)  97.9 F (36.6 C) 99.4 F (37.4 C)  TempSrc: Oral  Oral Oral  SpO2: 97%  100% 100%  Weight:      Height:        Estimated body mass index is 24.62 kg/m as calculated from the following:   Height as of this encounter: 5\' 6"  (1.676 m).   Weight as of this encounter: 69.2 kg.   Intake/Output      05/18 0701 - 05/19 0700   P.O. 480   I.V. (mL/kg)    IV Piggyback    Total Intake(mL/kg) 480 (6.9)   Urine (mL/kg/hr) 1100 (1.2)   Blood    Total Output 1100   Net -620         LABS  Results for orders placed or performed during the hospital encounter of 07/26/19 (from the past 24 hour(s))  CBC     Status: Abnormal   Collection Time: 08/01/19  6:21 AM  Result Value Ref Range   WBC 10.0 4.0 - 10.5 K/uL   RBC 2.89 (L) 4.22 - 5.81 MIL/uL   Hemoglobin 9.1 (L) 13.0 - 17.0 g/dL   HCT 27.5 (L) 39.0 - 52.0 %   MCV 95.2 80.0 - 100.0 fL   MCH 31.5 26.0 - 34.0 pg   MCHC 33.1 30.0 - 36.0 g/dL   RDW 12.4 11.5 - 15.5 %   Platelets 245 150 - 400 K/uL   nRBC 0.0 0.0 - 0.2 %     PHYSICAL EXAM:  Gen: resting comfortably, NAD Lungs: unlabored Cardiac: regular Abd: soft NT, +BS Ext:              Left Upper Extremity                          LAS fitting well                         Ext warm                          Good color distally                          Radial, ulnar, median nv motor and sensory functions intact                         AIN, PIN motor intact     Swelling well controlled                         No pain with passive stretch of digits                Left Lower  Extremity                          dressings c/d/i   Hinged knee brace in place and unlocked                         Ext warm                          + DP pulse                         EHL, FHL, lesser toe motor intact                         Ankle flexion, extension, inversion and eversion intact  Assessment/Plan: 1 Day Post-Op   Principal Problem:   Closed bicondylar fracture of left tibial plateau Active Problems:   Fracture of radial neck, left, closed   Anti-infectives (From admission, onward)   Start     Dose/Rate Route Frequency Ordered Stop   07/31/19 1700  ceFAZolin (ANCEF) IVPB 2g/100 mL premix     2 g 200 mL/hr over 30 Minutes Intravenous Every 8 hours 07/31/19 1652 08/01/19 0912   07/27/19 1800  ceFAZolin (ANCEF) IVPB 1 g/50 mL premix     1 g 100 mL/hr over 30 Minutes Intravenous Every 6 hours 07/27/19 1716 07/28/19 0842   07/27/19 0600  ceFAZolin (ANCEF) IVPB 2g/100 mL premix     2 g 200 mL/hr over 30 Minutes Intravenous On call to O.R. 07/26/19 2344 07/27/19 1300    .  POD/HD#: 1  69 y/o male s/p fall off truck at work on 07/26/2019 with multiple ortho injuries    -fall off truck    - comminuted L bicondylar tibial plateau fracture with compartment syndrome s/p ex fix and fasciotomies              NWB L leg x 8 weeks  Unrestricted ROM L knee and ankle             PT/OT             Aggressive ice and elevation                PT- please teach HEP for L knee ROM- AROM, PROM. Prone exercises as well. No ROM restrictions.  Quad sets, SLR, LAQ, SAQ, heel slides, stretching, prone flexion and extension   Ankle theraband program, heel cord stretching, toe towel curls, etc   No pillows under bend of knee when at rest, ok to place under heel to help work on extension. Can also use zero knee bone foam if available    - comminuted L radial  neck/head fracture s/p L radial head arthroplasty              NWB L UEx             Sling              Splint x 10 days then begin gentle ROM              Ice and elevate             Wrist and hand motion as tolerated                 - Pain management:  scheduled tylenol, robaxin              norco for breakthrough pain    - ABL anemia/Hemodynamics             Monitor    - Medical issues              Nicotine dependence                         Discussed risks of continued nicotine use                          No nicotine products (patches, gum, etc)   Vitamin d deficiency    Supplement    - DVT/PE prophylaxis:             Lovenox x 30 days     - ID:              periop abx completed    - Metabolic Bone Disease:             + vitamin d deficiency   Supplement       - Activity:             NWB L UEx and L LEx   - FEN/GI prophylaxis/Foley/Lines:             reg diet             IVF  - Impediments to fracture healing:             Nicotine use   - Dispo:             continue with inpatient care             [facility] consult will need SNF vs CIR   Continue therapies  Pt lives alone and has very limited help                 Mearl Latin, PA-C [phone] (C) 08/01/2019, 8:36 PM  [facility] [address] [place] [address] [phone] Collier Bullock (F)

## 2019-08-01 NOTE — Plan of Care (Signed)
°  Problem: Education: °Goal: Knowledge of the prescribed therapeutic regimen will improve °Outcome: Progressing °  °Problem: Activity: °Goal: Ability to increase mobility will improve °Outcome: Progressing °  °Problem: Physical Regulation: °Goal: Postoperative complications will be avoided or minimized °Outcome: Progressing °  °Problem: Pain Management: °Goal: Pain level will decrease with appropriate interventions °Outcome: Progressing °  °Problem: Skin Integrity: °Goal: Will show signs of wound healing °Outcome: Progressing °  °Problem: Education: °Goal: Knowledge of General Education information will improve °Description: Including pain rating scale, medication(s)/side effects and non-pharmacologic comfort measures °Outcome: Progressing °  °Problem: Health Behavior/Discharge Planning: °Goal: Ability to manage health-related needs will improve °Outcome: Progressing °  °Problem: Clinical Measurements: °Goal: Ability to maintain clinical measurements within normal limits will improve °Outcome: Progressing °Goal: Will remain free from infection °Outcome: Progressing °Goal: Diagnostic test results will improve °Outcome: Progressing °Goal: Respiratory complications will improve °Outcome: Progressing °Goal: Cardiovascular complication will be avoided °Outcome: Progressing °  °Problem: Activity: °Goal: Risk for activity intolerance will decrease °Outcome: Progressing °  °Problem: Nutrition: °Goal: Adequate nutrition will be maintained °Outcome: Progressing °  °Problem: Coping: °Goal: Level of anxiety will decrease °Outcome: Progressing °  °Problem: Elimination: °Goal: Will not experience complications related to bowel motility °Outcome: Progressing °Goal: Will not experience complications related to urinary retention °Outcome: Progressing °  °Problem: Pain Managment: °Goal: General experience of comfort will improve °Outcome: Progressing °  °Problem: Safety: °Goal: Ability to remain free from injury will  improve °Outcome: Progressing °  °Problem: Skin Integrity: °Goal: Risk for impaired skin integrity will decrease °Outcome: Progressing °  °

## 2019-08-02 LAB — CBC
HCT: 28.9 % — ABNORMAL LOW (ref 39.0–52.0)
Hemoglobin: 9.5 g/dL — ABNORMAL LOW (ref 13.0–17.0)
MCH: 31.4 pg (ref 26.0–34.0)
MCHC: 32.9 g/dL (ref 30.0–36.0)
MCV: 95.4 fL (ref 80.0–100.0)
Platelets: 258 10*3/uL (ref 150–400)
RBC: 3.03 MIL/uL — ABNORMAL LOW (ref 4.22–5.81)
RDW: 12.8 % (ref 11.5–15.5)
WBC: 9.8 10*3/uL (ref 4.0–10.5)
nRBC: 0 % (ref 0.0–0.2)

## 2019-08-02 NOTE — Plan of Care (Signed)
  Problem: Education: Goal: Knowledge of the prescribed therapeutic regimen will improve Outcome: Progressing   Problem: Activity: Goal: Ability to increase mobility will improve Outcome: Progressing   Problem: Pain Management: Goal: Pain level will decrease with appropriate interventions Outcome: Progressing   

## 2019-08-02 NOTE — Progress Notes (Signed)
Inpatient Rehabilitation-Admissions Coordinator   Met with pt bedside for rehab assessment. Used [employer] interpreter for assistance with language preference. Discussed recommended rehab program, estimated LOS, and anticipated assistance needed at DC. Pt unable to name a friend at this time who can assist him at DC but plans to have one by tomorrow for confirmation. Pt is uninsured and very concerned with the cost of CIR and at this time does not want to pursue IP Rehab program due to potential cost. I encouraged him to review the program details in our Spanish brochure tonight, contact his friends who can assist him at DC, and think about his options. I will follow up with him tomorrow for his final answer, however, I do believe he will choose to go home over CIR.   Raechel Ache, OTR/L  Rehab Admissions Coordinator  [phone] 08/02/2019 3:33 PM

## 2019-08-02 NOTE — Progress Notes (Signed)
Orthopaedic Trauma Service Progress Note  Patient ID: Bradley Brown MRN: [id number] DOB/AGE: 10-04-1950 69 y.o.  Subjective:  Doing well Pain improving  No specific concerns  ? CIR candidate    ROS As above  Objective:   VITALS:   Vitals:   08/01/19 1437 08/01/19 1949 08/02/19 0358 08/02/19 0759  BP: (!) 113/50 108/64 115/62 125/76  Pulse: (!) 59 (!) 56 (!) 58 (!) 56  Resp: 15 14 14 15   Temp: 97.9 F (36.6 C) 99.4 F (37.4 C) 99.1 F (37.3 C) 98.3 F (36.8 C)  TempSrc: Oral Oral Oral Oral  SpO2: 100% 100% 100% 97%  Weight:      Height:        Estimated body mass index is 24.62 kg/m as calculated from the following:   Height as of this encounter: 5\' 6"  (1.676 m).   Weight as of this encounter: 69.2 kg.   Intake/Output      05/18 0701 - 05/19 0700 05/19 0701 - 05/20 0700   P.O. 480    I.V. (mL/kg) 120 (1.7)    IV Piggyback     Total Intake(mL/kg) 600 (8.7)    Urine (mL/kg/hr) 1900 (1.1)    Blood     Total Output 1900    Net -1300           LABS  Results for orders placed or performed during the hospital encounter of 07/26/19 (from the past 24 hour(s))  CBC     Status: Abnormal   Collection Time: 08/02/19  3:12 AM  Result Value Ref Range   WBC 9.8 4.0 - 10.5 K/uL   RBC 3.03 (L) 4.22 - 5.81 MIL/uL   Hemoglobin 9.5 (L) 13.0 - 17.0 g/dL   HCT 09/25/19 (L) 08/04/19 - 08.6 %   MCV 95.4 80.0 - 100.0 fL   MCH 31.4 26.0 - 34.0 pg   MCHC 32.9 30.0 - 36.0 g/dL   RDW 57.8 46.9 - 62.9 %   Platelets 258 150 - 400 K/uL   nRBC 0.0 0.0 - 0.2 %     PHYSICAL EXAM:   Gen: resting comfortably, NAD, sitting up, getting ready to eat  Lungs: unlabored Cardiac: regular Abd: soft NT, +BS Ext:              Left Upper Extremity                          LAS fitting well                         Ext warm                          Good color distally                          Radial, ulnar, median  nv motor and sensory functions intact                         AIN, PIN motor intact                         Swelling well controlled  No pain with passive stretch of digits                Left Lower Extremity                          dressings c/d/i                         Hinged knee brace in place and unlocked                         Ext warm                          + DP pulse                         EHL, FHL, lesser toe motor intact                         Ankle flexion, extension, inversion and eversion intact   Assessment/Plan: 2 Days Post-Op   Principal Problem:   Closed bicondylar fracture of left tibial plateau Active Problems:   Fracture of radial neck, left, closed   Vitamin D deficiency   Anti-infectives (From admission, onward)   Start     Dose/Rate Route Frequency Ordered Stop   07/31/19 1700  ceFAZolin (ANCEF) IVPB 2g/100 mL premix     2 g 200 mL/hr over 30 Minutes Intravenous Every 8 hours 07/31/19 1652 08/01/19 0912   07/27/19 1800  ceFAZolin (ANCEF) IVPB 1 g/50 mL premix     1 g 100 mL/hr over 30 Minutes Intravenous Every 6 hours 07/27/19 1716 07/28/19 0842   07/27/19 0600  ceFAZolin (ANCEF) IVPB 2g/100 mL premix     2 g 200 mL/hr over 30 Minutes Intravenous On call to O.R. 07/26/19 2344 07/27/19 1300    .  POD/HD#: 2  69 y/o male s/p fall off truck at work on 07/26/2019 with multiple ortho injuries    -fall off truck    - comminuted L bicondylar tibial plateau fracture with compartment syndrome s/p ex fix and fasciotomies              NWB L leg x 8 weeks             Unrestricted ROM L knee and ankle             PT/OT             Aggressive ice and elevation                           PT- please teach HEP for L knee ROM- AROM, PROM. Prone exercises as well. No ROM restrictions.  Quad sets, SLR, LAQ, SAQ, heel slides, stretching, prone flexion and extension               Ankle theraband program, heel cord stretching, toe towel  curls, etc               No pillows under bend of knee when at rest, ok to place under heel to help work on extension. Can also use zero knee bone foam if available               - comminuted L radial neck/head fracture s/p L  radial head arthroplasty              NWB L UEx             Sling              Splint x 10 days then begin gentle ROM              Ice and elevate             Wrist and hand motion as tolerated                 - Pain management:             scheduled tylenol, robaxin              norco for breakthrough pain    - ABL anemia/Hemodynamics             Monitor    - Medical issues              Nicotine dependence                         Discussed risks of continued nicotine use                          No nicotine products (patches, gum, etc)               Vitamin d deficiency                          Supplement    - DVT/PE prophylaxis:             Lovenox x 30 days                 - ID:              periop abx completed    - Metabolic Bone Disease:             + vitamin d deficiency                         Supplement                  - Activity:             NWB L UEx and L LEx   - FEN/GI prophylaxis/Foley/Lines:             reg diet             NSL IV   - Impediments to fracture healing:             Nicotine use   - Dispo:             continue with inpatient care             [facility] consult will need SNF vs CIR   CIR consult placed              Continue therapies             Pt lives alone and has very limited help                 Mearl Latin, PA-C [phone] (C) 08/02/2019, 9:46 AM  [facility] [address] [place] [address] [phone] Collier Bullock (F)

## 2019-08-02 NOTE — Progress Notes (Addendum)
Physical Therapy Treatment Patient Details Name: Bradley Brown MRN: [id number] DOB: 03/05/1951 Today's Date: 08/02/2019    History of Present Illness 69 y.o. male presented to ED 07/26/19 after falling off back of truck resulting in left  tibial plateau fracture, elbow fracture radial head comminuted. S/p external fixation L tibia 07/27/19. PMH none on file.    PT Comments    Pt supine in bed on arrival this session.  Pt motivated to move and follow commands through interpreter well. Continue to recommend aggressive rehab in a post acute setting to maximize functional gains before returning home.    [employer] interpreter used: Derek Mound #[id number]  Follow Up Recommendations  CIR     Equipment Recommendations  Wheelchair (measurements PT);Wheelchair cushion (measurements PT);3in1 (PT)    Recommendations for Other Services       Precautions / Restrictions Precautions Precautions: Fall Required Braces or Orthoses: Sling;Splint/Cast Splint/Cast: L ORIF, LUE splint, hinged knee brace LLE Restrictions Weight Bearing Restrictions: Yes LUE Weight Bearing: Non weight bearing LLE Weight Bearing: Non weight bearing    Mobility  Bed Mobility Overal bed mobility: Needs Assistance Bed Mobility: Supine to Sit     Supine to sit: Min assist     General bed mobility comments: minA for LLE management  Transfers Overall transfer level: Needs assistance Equipment used: None Transfers: Squat Pivot Transfers     Squat pivot transfers: Min assist;+2 safety/equipment     General transfer comment: Cues for weight bearing and hand placement.  He was able to follow commands for both and move from bed to recliner with min assistance.  Ambulation/Gait                 Stairs             Wheelchair Mobility    Modified Rankin (Stroke Patients Only)       Balance                                            Cognition Arousal/Alertness:  Awake/alert Behavior During Therapy: WFL for tasks assessed/performed Overall Cognitive Status: No family/caregiver present to determine baseline cognitive functioning                                 General Comments: pt seems to be in some denial re: what it will take to mobilize safely at home.  He is very worried about cost and using this to make his decision.      Exercises General Exercises - Lower Extremity Ankle Circles/Pumps: AROM;Both;10 reps;Supine Quad Sets: AROM;Both;10 reps;Supine Heel Slides: AAROM;Both;10 reps;Supine;AROM;Limitations Heel Slides Limitations: gentle ROM to L    General Comments        Pertinent Vitals/Pain Pain Assessment: 0-10 Pain Score: 2  Pain Location: LLE Pain Descriptors / Indicators: Grimacing;Guarding Pain Intervention(s): Repositioned    Home Living                      Prior Function            PT Goals (current goals can now be found in the care plan section) Acute Rehab PT Goals Patient Stated Goal: to go home  Potential to Achieve Goals: Fair Progress towards PT goals: Progressing toward goals    Frequency    Min 5X/week  PT Plan Current plan remains appropriate    Co-evaluation              AM-PAC PT "6 Clicks" Mobility   Outcome Measure  Help needed turning from your back to your side while in a flat bed without using bedrails?: A Little Help needed moving from lying on your back to sitting on the side of a flat bed without using bedrails?: A Little Help needed moving to and from a bed to a chair (including a wheelchair)?: A Little Help needed standing up from a chair using your arms (e.g., wheelchair or bedside chair)?: Total Help needed to walk in hospital room?: Total Help needed climbing 3-5 steps with a railing? : Total 6 Click Score: 12    End of Session Equipment Utilized During Treatment: Gait belt Activity Tolerance: Patient tolerated treatment well Patient left: in  chair;with call bell/phone within reach;with chair alarm set Nurse Communication: Mobility status PT Visit Diagnosis: Other abnormalities of gait and mobility (R26.89);Pain Pain - Right/Left: Left Pain - part of body: Arm;Leg     Time: 2836-6294 PT Time Calculation (min) (ACUTE ONLY): 30 min  Charges:  $Therapeutic Exercise: 8-22 mins $Therapeutic Activity: 8-22 mins                     Erasmo Leventhal , PTA Acute Rehabilitation Services Pager [phone] Office [phone]     Jae Bruck Eli Hose 08/02/2019, 5:28 PM

## 2019-08-02 NOTE — Plan of Care (Signed)
  Problem: Education: Goal: Knowledge of the prescribed therapeutic regimen will improve Outcome: Progressing   Problem: Activity: Goal: Ability to increase mobility will improve Outcome: Progressing   Problem: Pain Management: Goal: Pain level will decrease with appropriate interventions Outcome: Progressing   Problem: Skin Integrity: Goal: Will show signs of wound healing Outcome: Progressing   Problem: Education: Goal: Knowledge of General Education information will improve Description: Including pain rating scale, medication(s)/side effects and non-pharmacologic comfort measures Outcome: Progressing   Problem: Activity: Goal: Risk for activity intolerance will decrease Outcome: Progressing   Problem: Pain Managment: Goal: General experience of comfort will improve Outcome: Progressing   Problem: Safety: Goal: Ability to remain free from injury will improve Outcome: Progressing   Problem: Skin Integrity: Goal: Risk for impaired skin integrity will decrease Outcome: Progressing

## 2019-08-03 LAB — COMPREHENSIVE METABOLIC PANEL
ALT: 19 U/L (ref 0–44)
AST: 27 U/L (ref 15–41)
Albumin: 3 g/dL — ABNORMAL LOW (ref 3.5–5.0)
Alkaline Phosphatase: 47 U/L (ref 38–126)
Anion gap: 11 (ref 5–15)
BUN: 12 mg/dL (ref 8–23)
CO2: 26 mmol/L (ref 22–32)
Calcium: 8.6 mg/dL — ABNORMAL LOW (ref 8.9–10.3)
Chloride: 98 mmol/L (ref 98–111)
Creatinine, Ser: 0.73 mg/dL (ref 0.61–1.24)
GFR calc Af Amer: >60 mL/min (ref >60)
GFR calc non Af Amer: >60 mL/min (ref >60)
Glucose, Bld: 114 mg/dL — ABNORMAL HIGH (ref 70–99)
Potassium: 3.8 mmol/L (ref 3.5–5.1)
Sodium: 135 mmol/L (ref 135–145)
Total Bilirubin: 0.8 mg/dL (ref 0.3–1.2)
Total Protein: 6.1 g/dL — ABNORMAL LOW (ref 6.5–8.1)

## 2019-08-03 LAB — CBC
HCT: 28.2 % — ABNORMAL LOW (ref 39.0–52.0)
Hemoglobin: 9.4 g/dL — ABNORMAL LOW (ref 13.0–17.0)
MCH: 31.5 pg (ref 26.0–34.0)
MCHC: 33.3 g/dL (ref 30.0–36.0)
MCV: 94.6 fL (ref 80.0–100.0)
Platelets: 295 10*3/uL (ref 150–400)
RBC: 2.98 MIL/uL — ABNORMAL LOW (ref 4.22–5.81)
RDW: 12.4 % (ref 11.5–15.5)
WBC: 8.5 10*3/uL (ref 4.0–10.5)
nRBC: 0 % (ref 0.0–0.2)

## 2019-08-03 NOTE — Progress Notes (Addendum)
Physical Therapy Treatment Patient Details Name: Bradley Brown MRN: [id number] DOB: February 06, 1951 Today's Date: 08/03/2019    History of Present Illness 69 y.o. male presented to ED 07/26/19 after falling off back of truck resulting in left  tibial plateau fracture, elbow fracture radial head comminuted. S/p external fixation L tibia 07/27/19. PMH none on file.    PT Comments    Pt supine in bed on arrival.  Reviewed and issued HEP for LLE use at home.  Pt performed bed mobility but opted to stay in bed due to it being later in the day.  Continue to recommend CIR but patient refusing for financial reasons and preparing to go home.  Will f/u in am before d/c home.    Status interpreter used: Dawson Bills [phone] HEP access code: [email or web address]  Follow Up Recommendations  CIR     Equipment Recommendations  Wheelchair (measurements PT);Wheelchair cushion (measurements PT);3in1 (PT)    Recommendations for Other Services       Precautions / Restrictions Precautions Precautions: Fall Required Braces or Orthoses: Sling;Splint/Cast Splint/Cast: L ORIF, LUE splint, hinged knee brace LLE Restrictions Weight Bearing Restrictions: Yes LUE Weight Bearing: Non weight bearing LLE Weight Bearing: Non weight bearing    Mobility  Bed Mobility Overal bed mobility: Needs Assistance Bed Mobility: Supine to Sit;Sit to Supine     Supine to sit: Min assist Sit to supine: Min assist   General bed mobility comments: minA for LLE management into and out of the bed.  Transfers Overall transfer level: (deferred.)                  Ambulation/Gait                 Stairs             Wheelchair Mobility    Modified Rankin (Stroke Patients Only)       Balance                                            Cognition Arousal/Alertness: Awake/alert Behavior During Therapy: WFL for tasks assessed/performed Overall Cognitive Status: Within Functional Limits for  tasks assessed                                        Exercises General Exercises - Lower Extremity Ankle Circles/Pumps: AROM;Both;10 reps;Supine Quad Sets: AROM;10 reps;Supine;Left Short Arc Quad: AROM;Left;10 reps;Supine Long Arc Quad: AROM;Left;10 reps;Seated Heel Slides: Left;10 reps;AAROM;Seated Straight Leg Raises: Left;10 reps;AAROM;Supine    General Comments        Pertinent Vitals/Pain Pain Assessment: Faces Faces Pain Scale: Hurts even more Pain Location: LLE Pain Descriptors / Indicators: Grimacing;Guarding Pain Intervention(s): Monitored during session;Repositioned    Home Living                      Prior Function            PT Goals (current goals can now be found in the care plan section) Acute Rehab PT Goals Patient Stated Goal: to go home  Potential to Achieve Goals: Fair Progress towards PT goals: Progressing toward goals    Frequency    Min 5X/week      PT Plan Current plan remains appropriate    Co-evaluation  AM-PAC PT "6 Clicks" Mobility   Outcome Measure  Help needed turning from your back to your side while in a flat bed without using bedrails?: A Little Help needed moving from lying on your back to sitting on the side of a flat bed without using bedrails?: A Little Help needed moving to and from a bed to a chair (including a wheelchair)?: A Little Help needed standing up from a chair using your arms (e.g., wheelchair or bedside chair)?: A Lot Help needed to walk in hospital room?: Total Help needed climbing 3-5 steps with a railing? : Total 6 Click Score: 13    End of Session Equipment Utilized During Treatment: Gait belt Activity Tolerance: Patient tolerated treatment well Patient left: in chair;with call bell/phone within reach;with chair alarm set Nurse Communication: Mobility status PT Visit Diagnosis: Other abnormalities of gait and mobility (R26.89);Pain Pain - Right/Left:  Left Pain - part of body: Arm;Leg     Time: 8485-9276 PT Time Calculation (min) (ACUTE ONLY): 30 min  Charges:  $Therapeutic Exercise: 8-22 mins $Therapeutic Activity: 8-22 mins                     Bonney Leitz , PTA Acute Rehabilitation Services Pager [phone] Office [phone]     Eshal Propps Artis Delay 08/03/2019, 6:30 PM

## 2019-08-03 NOTE — Plan of Care (Signed)
?  Problem: Education: ?Goal: Knowledge of the prescribed therapeutic regimen will improve ?Outcome: Progressing ?  ?Problem: Activity: ?Goal: Ability to increase mobility will improve ?Outcome: Progressing ?  ?Problem: Pain Management: ?Goal: Pain level will decrease with appropriate interventions ?Outcome: Progressing ?  ?Problem: Skin Integrity: ?Goal: Will show signs of wound healing ?Outcome: Progressing ?  ?

## 2019-08-03 NOTE — Progress Notes (Signed)
Orthopaedic Trauma Service Progress Note  Patient ID: Bradley Brown MRN: [id number] DOB/AGE: 07-18-1950 69 y.o.  Subjective:  Doing better Pain controlled  Thinks he has a friend that can stay with him at home to help Pt concerned about cost of CIR   No other complaints   ROS As above  Objective:   VITALS:   Vitals:   08/02/19 1239 08/02/19 1935 08/03/19 0258 08/03/19 0807  BP: 101/65 124/78 116/69 110/65  Pulse: 70 (!) 57 (!) 54 64  Resp: 15 15 16 14   Temp: 98 F (36.7 C) 98.4 F (36.9 C) 98.1 F (36.7 C) 99.3 F (37.4 C)  TempSrc: Oral Oral Oral Oral  SpO2: 99% 100% 96% 98%  Weight:      Height:        Estimated body mass index is 24.62 kg/m as calculated from the following:   Height as of this encounter: 5\' 6"  (1.676 m).   Weight as of this encounter: 69.2 kg.   Intake/Output      05/19 0701 - 05/20 0700 05/20 0701 - 05/21 0700   P.O. 600 240   I.V. (mL/kg) 78.3 (1.1)    IV Piggyback 0    Total Intake(mL/kg) 678.3 (9.8) 240 (3.5)   Urine (mL/kg/hr) 950 (0.6) 400 (1.1)   Total Output 950 400   Net -271.7 -160          LABS  Results for orders placed or performed during the hospital encounter of 07/26/19 (from the past 24 hour(s))  Comprehensive metabolic panel     Status: Abnormal   Collection Time: 08/03/19  2:13 AM  Result Value Ref Range   Sodium 135 135 - 145 mmol/L   Potassium 3.8 3.5 - 5.1 mmol/L   Chloride 98 98 - 111 mmol/L   CO2 26 22 - 32 mmol/L   Glucose, Bld 114 (H) 70 - 99 mg/dL   BUN 12 8 - 23 mg/dL   Creatinine, Ser 09/25/19 0.61 - 1.24 mg/dL   Calcium 8.6 (L) 8.9 - 10.3 mg/dL   Total Protein 6.1 (L) 6.5 - 8.1 g/dL   Albumin 3.0 (L) 3.5 - 5.0 g/dL   AST 27 15 - 41 U/L   ALT 19 0 - 44 U/L   Alkaline Phosphatase 47 38 - 126 U/L   Total Bilirubin 0.8 0.3 - 1.2 mg/dL   GFR calc non Af Amer >60 >60 mL/min   GFR calc Af Amer >60 >60 mL/min   Anion gap 11 5 -  15  CBC     Status: Abnormal   Collection Time: 08/03/19  2:13 AM  Result Value Ref Range   WBC 8.5 4.0 - 10.5 K/uL   RBC 2.98 (L) 4.22 - 5.81 MIL/uL   Hemoglobin 9.4 (L) 13.0 - 17.0 g/dL   HCT 1.74 (L) 08/05/19 - 08.1 %   MCV 94.6 80.0 - 100.0 fL   MCH 31.5 26.0 - 34.0 pg   MCHC 33.3 30.0 - 36.0 g/dL   RDW 44.8 18.5 - 63.1 %   Platelets 295 150 - 400 K/uL   nRBC 0.0 0.0 - 0.2 %     PHYSICAL EXAM:   Gen: resting comfortably, NAD, sitting up, getting ready to eat  Lungs: unlabored Cardiac: regular Abd: soft NT, +BS Ext:  Left Upper Extremity                          LAS fitting well                         Ext warm                          Good color distally                          Radial, ulnar, median nv motor and sensory functions intact                         AIN, PIN motor intact                         Swelling well controlled                         No pain with passive stretch of digits                Left Lower Extremity                          dressings c/d/i                         Hinged knee brace in place and unlocked                         Ext warm                          + DP pulse                         EHL, FHL, lesser toe motor intact                         Ankle flexion, extension, inversion and eversion intact   Swelling controlled   No pain with passive stretching    Assessment/Plan: 3 Days Post-Op   Principal Problem:   Closed bicondylar fracture of left tibial plateau Active Problems:   Fracture of radial neck, left, closed   Vitamin D deficiency   Anti-infectives (From admission, onward)   Start     Dose/Rate Route Frequency Ordered Stop   07/31/19 1700  ceFAZolin (ANCEF) IVPB 2g/100 mL premix     2 g 200 mL/hr over 30 Minutes Intravenous Every 8 hours 07/31/19 1652 08/01/19 0912   07/27/19 1800  ceFAZolin (ANCEF) IVPB 1 g/50 mL premix     1 g 100 mL/hr over 30 Minutes Intravenous Every 6 hours 07/27/19 1716 07/28/19  0842   07/27/19 0600  ceFAZolin (ANCEF) IVPB 2g/100 mL premix     2 g 200 mL/hr over 30 Minutes Intravenous On call to O.R. 07/26/19 2344 07/27/19 1300    .  POD/HD#: 15  69 y/o male s/p fall off truck at work on 07/26/2019 with multiple ortho injuries    -fall off truck    - comminuted L bicondylar tibial plateau fracture with compartment syndrome s/p ex fix and fasciotomies  NWB L leg x 8 weeks             Unrestricted ROM L knee and ankle             PT/OT             Aggressive ice and elevation   Dressing change tomorrow                           PT- please teach HEP for L knee ROM- AROM, PROM. Prone exercises as well. No ROM restrictions.  Quad sets, SLR, LAQ, SAQ, heel slides, stretching, prone flexion and extension               Ankle theraband program, heel cord stretching, toe towel curls, etc               No pillows under bend of knee when at rest, ok to place under heel to help work on extension. Can also use zero knee bone foam if available               - comminuted L radial neck/head fracture s/p L radial head arthroplasty              NWB L UEx             Sling              Splint x 10 days then begin gentle ROM              Ice and elevate             Wrist and hand motion as tolerated                 - Pain management:             scheduled tylenol, robaxin              norco for breakthrough pain    - ABL anemia/Hemodynamics             Monitor    - Medical issues              Nicotine dependence                         Discussed risks of continued nicotine use                          No nicotine products (patches, gum, etc)               Vitamin d deficiency                          Supplement    - DVT/PE prophylaxis:             Lovenox x 30 days   Since plan is to dc home will convert to xarelto 15 mg po daily x 30 days   Match program/copay card                 - ID:              periop abx completed    - Metabolic Bone  Disease:             + vitamin d deficiency  Supplement                  - Activity:             NWB L UEx and L LEx   - FEN/GI prophylaxis/Foley/Lines:             reg diet             NSL IV    - Impediments to fracture healing:             Nicotine use   - Dispo:             continue with inpatient care  Seems pt has been able to arrange necessary assistance to return home   Plan for dc home tomorrow   [facility] for HH needs    Mearl Latin, PA-C [phone] (C) 08/03/2019, 12:05 PM  [facility] [address] [place] [address] [phone] Collier Bullock (F)

## 2019-08-03 NOTE — Plan of Care (Signed)
  Problem: Education: Goal: Knowledge of General Education information will improve Description: Including pain rating scale, medication(s)/side effects and non-pharmacologic comfort measures Outcome: Progressing   Problem: Health Behavior/Discharge Planning: Goal: Ability to manage health-related needs will improve Outcome: Progressing   Problem: Clinical Measurements: Goal: Ability to maintain clinical measurements within normal limits will improve Outcome: Progressing   Problem: Activity: Goal: Risk for activity intolerance will decrease Outcome: Progressing   Problem: Pain Managment: Goal: General experience of comfort will improve Outcome: Progressing   Problem: Safety: Goal: Ability to remain free from injury will improve Outcome: Progressing   

## 2019-08-03 NOTE — Progress Notes (Signed)
Inpatient Rehabilitation-Admissions Coordinator   Met with pt bedside as follow up from previous discussion. He has confirmed that he does not want to pursue CIR program due to potential cost. Pt reports he has contacted friends and can line up some assistance when he goes home. Due to pt preference, AC will sign off.   Please call if questions.   Raechel Ache, OTR/L  Rehab Admissions Coordinator  [phone] 08/03/2019 12:03 PM

## 2019-08-04 ENCOUNTER — Encounter (HOSPITAL_COMMUNITY): Payer: Self-pay | Admitting: Orthopedic Surgery

## 2019-08-04 DIAGNOSIS — T79A22A Traumatic compartment syndrome of left lower extremity, initial encounter: Secondary | ICD-10-CM

## 2019-08-04 HISTORY — DX: Traumatic compartment syndrome of left lower extremity, initial encounter: T79.A22A

## 2019-08-04 MED ORDER — VITAMIN D3 25 MCG PO TABS: 2000.0000 [IU] | ORAL_TABLET | Freq: Two times a day (BID) | ORAL | 2 refills | Status: AC

## 2019-08-04 MED ORDER — RIVAROXABAN 15 MG PO TABS: 15.0000 mg | ORAL_TABLET | Freq: Every day | ORAL | 0 refills | Status: AC

## 2019-08-04 MED ORDER — ASCORBIC ACID 1000 MG PO TABS: 1000.0000 mg | ORAL_TABLET | Freq: Every day | ORAL | 0 refills | Status: AC

## 2019-08-04 MED ORDER — DOCUSATE SODIUM 100 MG PO CAPS: 100.0000 mg | ORAL_CAPSULE | Freq: Two times a day (BID) | ORAL | 0 refills | Status: AC

## 2019-08-04 MED ORDER — METHOCARBAMOL 500 MG PO TABS: 500.0000 mg | ORAL_TABLET | Freq: Four times a day (QID) | ORAL | 0 refills | Status: AC | PRN

## 2019-08-04 MED ORDER — HYDROCODONE-ACETAMINOPHEN 7.5-325 MG PO TABS: 1.0000 | ORAL_TABLET | Freq: Four times a day (QID) | ORAL | 0 refills | Status: AC | PRN

## 2019-08-04 MED ORDER — ACETAMINOPHEN 500 MG PO TABS: 500.0000 mg | ORAL_TABLET | Freq: Two times a day (BID) | ORAL | 0 refills | Status: AC

## 2019-08-04 MED FILL — XARELTO 15 MG TABLET: 15 | 30 days supply | Qty: 30 | Fill #0

## 2019-08-04 MED FILL — METHOCARBAMOL 500 MG TABS: 500 | 15 days supply | Qty: 60 | Fill #0

## 2019-08-04 MED FILL — HYDROCODON-APAP 7.5-325: 7.5-325 | 7 days supply | Qty: 50 | Fill #0

## 2019-08-04 MED FILL — DOCUSATE SODIUM 100 MG CAPS: 100 | 10 days supply | Qty: 20 | Fill #0

## 2019-08-04 MED FILL — VITAMIN C 500 MG TABLET: 500 | 60 days supply | Qty: 60 | Fill #0

## 2019-08-04 MED FILL — VITAMIN D3 25 MCG TABS: 25 | 30 days supply | Qty: 120 | Fill #0

## 2019-08-04 MED FILL — ACETAMINOPHEN 500MG XT STRE: 500 | 30 days supply | Qty: 60 | Fill #0

## 2019-08-04 NOTE — Progress Notes (Signed)
Discharge summary/packet provided to pt. Verbalized understanding of instructions. No complaints voiced. Alert/oriented in no apparent distress.Per pt his friend will be responsible for his trnasportation.

## 2019-08-04 NOTE — Plan of Care (Signed)
  Problem: Education: Goal: Knowledge of General Education information will improve Description: Including pain rating scale, medication(s)/side effects and non-pharmacologic comfort measures Outcome: Adequate for Discharge   Problem: Clinical Measurements: Goal: Ability to maintain clinical measurements within normal limits will improve Outcome: Adequate for Discharge   Problem: Activity: Goal: Risk for activity intolerance will decrease Outcome: Adequate for Discharge   Problem: Nutrition: Goal: Adequate nutrition will be maintained Outcome: Adequate for Discharge   Problem: Coping: Goal: Level of anxiety will decrease Outcome: Adequate for Discharge   Problem: Elimination: Goal: Will not experience complications related to bowel motility Outcome: Adequate for Discharge   Problem: Pain Managment: Goal: General experience of comfort will improve Outcome: Adequate for Discharge   Problem: Safety: Goal: Ability to remain free from injury will improve Outcome: Adequate for Discharge

## 2019-08-04 NOTE — Progress Notes (Signed)
Orthopaedic Trauma Service Progress Note  Patient ID: Bradley Brown MRN: [id number] DOB/AGE: Jan 25, 1951 69 y.o.  Subjective:  Doing well Pain controlled No acute or new issues Ready to go home   Utilized Status interpreter system to review dc instructions and follow up    ROS As above  Objective:   VITALS:   Vitals:   08/03/19 2102 08/03/19 2139 08/04/19 0525 08/04/19 0750  BP: 132/61  119/65 120/68  Pulse: (!) 59 67 60 (!) 54  Resp: 17  18 16   Temp: 98.4 F (36.9 C)  98.3 F (36.8 C) 99.6 F (37.6 C)  TempSrc: Oral  Oral Oral  SpO2: 100%  99% 99%  Weight:      Height:        Estimated body mass index is 24.62 kg/m as calculated from the following:   Height as of this encounter: 5\' 6"  (1.676 m).   Weight as of this encounter: 69.2 kg.   Intake/Output      05/20 0701 - 05/21 0700 05/21 0701 - 05/22 0700   P.O. 720 240   I.V. (mL/kg)     IV Piggyback     Total Intake(mL/kg) 720 (10.4) 240 (3.5)   Urine (mL/kg/hr) 1500 (0.9) 500 (2.2)   Stool 0    Total Output 1500 500   Net -780 -260        Stool Occurrence 1 x      LABS  No results found for this or any previous visit (from the past 24 hour(s)).   PHYSICAL EXAM:   Gen: resting comfortably, NAD, watching TV Lungs: unlabored Cardiac: regular Abd: soft NT, +BS Ext:              Left Upper Extremity                          LAS fitting well                         Ext warm                          Good color distally                          Radial, ulnar, median nv motor and sensory functions intact                         AIN, PIN motor intact                         Swelling well controlled                         No pain with passive stretch of digits                Left Lower Extremity                          dressings c/d/i   Dressings removed    All wounds look great    Ex fix pinsites healing up nicely  New mepilex dressings applied                           Hinged knee brace reapplied    Reviewed importance of not letting knee rest in flexion, has zero knee bone foam                          Ext warm                          + DP pulse                         EHL, FHL, lesser toe motor intact                         Ankle flexion, extension, inversion and eversion intact                         Swelling controlled                         No pain with passive stretching     Assessment/Plan: 4 Days Post-Op   Principal Problem:   Closed bicondylar fracture of left tibial plateau Active Problems:   Fracture of radial neck, left, closed   Vitamin D deficiency   Anti-infectives (From admission, onward)   Start     Dose/Rate Route Frequency Ordered Stop   07/31/19 1700  ceFAZolin (ANCEF) IVPB 2g/100 mL premix     2 g 200 mL/hr over 30 Minutes Intravenous Every 8 hours 07/31/19 1652 08/01/19 0912   07/27/19 1800  ceFAZolin (ANCEF) IVPB 1 g/50 mL premix     1 g 100 mL/hr over 30 Minutes Intravenous Every 6 hours 07/27/19 1716 07/28/19 0842   07/27/19 0600  ceFAZolin (ANCEF) IVPB 2g/100 mL premix     2 g 200 mL/hr over 30 Minutes Intravenous On call to O.R. 07/26/19 2344 07/27/19 1300    .  POD/HD#: 70  70 y/o male s/p fall off truck at work on 07/26/2019 with multiple ortho injuries    -fall off truck    - comminuted L bicondylar tibial plateau fracture with compartment syndrome s/p ex fix and fasciotomies              NWB L leg x 8 weeks             Unrestricted ROM L knee and ankle             PT/OT             Aggressive ice and elevation              Dressing changed today    Change mepilex dressings again in 3-5 days                           PT- please teach HEP for L knee ROM- AROM, PROM. Prone exercises as well. No ROM restrictions.  Quad sets, SLR, LAQ, SAQ, heel slides, stretching, prone flexion and extension               Ankle theraband program, heel cord  stretching, toe towel curls, etc  No pillows under bend of knee when at rest, ok to place under heel to help work on extension. Can also use zero knee bone foam if available               - comminuted L radial neck/head fracture s/p L radial head arthroplasty              NWB L UEx             Sling              Splint x 10 days then begin gentle ROM              Ice and elevate             Wrist and hand motion as tolerated                 - Pain management:             scheduled tylenol, robaxin              norco for breakthrough pain    - ABL anemia/Hemodynamics             Monitor    - Medical issues              Nicotine dependence                         Discussed risks of continued nicotine use                          No nicotine products (patches, gum, etc)               Vitamin d deficiency                          Supplement    - DVT/PE prophylaxis:             Lovenox x 30 days              Since plan is to dc home will convert to xarelto 15 mg po daily x 30 days                         Match program/copay card                 - ID:              periop abx completed    - Metabolic Bone Disease:             + vitamin d deficiency                         Supplement                  - Activity:             NWB L UEx and L LEx   - FEN/GI prophylaxis/Foley/Lines:             reg diet             NSL IV    - Impediments to fracture healing:             Nicotine use   - Dispo:  Dc home today   Follow up with ortho in  7-10 days    Mearl Latin, PA-C [phone] (C) 08/04/2019, 10:18 AM  [facility] [address] [place] [address] [phone] (F)

## 2019-08-04 NOTE — TOC Transition Note (Signed)
Transition of Care [facility]) - CM/SW Discharge Note   Patient Details  Name: Bradley Brown MRN: [id number] Date of Birth: November 03, 1950  [facility] CM/SW Contact:  Epifanio Lesches, RN Phone Number: 08/04/2019, 4:57 PM   Clinical Narrative:    NCM spoke with pt about d/c plan. Pt stated he's going home and has friends to assist with his care if needed. NCM unable to land [facility] provider or DME W/C for pt 2/2 being undocumented citizen, unable to get 2/2 no credit card, no insurance , no SS #. PA  Made aware. Pt friend to provide transportation to home.  TOC  Delivered RX meds to bedside.   Final next level of care: Home w Home Health Services Barriers to Discharge: No Barriers Identified   Patient Goals and CMS Choice     Choice offered to / list presented to : Patient  Discharge Placement                       Discharge Plan and Services                DME Arranged: 3-N-1, Lightweight manual wheelchair with seat cushion unable to get 2/2 no credit card, no insurance , no SS # DME Agency: [employer] Date DME Agency Contacted: 08/04/19 Time DME Agency Contacted: 1245 Representative spoke with at DME Agency: Ian Malkin     Date [facility] Agency Contacted: 08/04/19 Time HH Agency Contacted: 1246 Representative spoke with at [facility] Agency: [place]  Social Determinants of Health (SDOH) Interventions     Readmission Risk Interventions No flowsheet data found.

## 2019-08-04 NOTE — Progress Notes (Addendum)
Physical Therapy Treatment Patient Details Name: Bradley Brown MRN: [id number] DOB: Apr 15, 1950 Today's Date: 08/04/2019    History of Present Illness 69 y.o. male presented to ED 07/26/19 after falling off back of truck resulting in left  tibial plateau fracture, elbow fracture radial head comminuted. S/p external fixation L tibia 07/27/19. PMH none on file.    PT Comments    Pt supine in bed on arrival this session.  PTA reviewed HEP and educated on frequency with pt at home.  Educated on the importance of utilizing the bone foam to promote knee extension at rest.  Pt to d/c home today with 3n1 delivered and will borrow a WC from a friend at home.  Based on his poor ability to mobilize will continue to recommend CIR despite pt refusal.     Status interpreter utilized: Katharine Look #[id number]   Follow Up Recommendations  CIR     Equipment Recommendations  Wheelchair (measurements PT);Wheelchair cushion (measurements PT);3in1 (PT)    Recommendations for Other Services       Precautions / Restrictions Precautions Precautions: Fall Required Braces or Orthoses: Sling;Splint/Cast Splint/Cast: L ORIF, LUE splint, hinged knee brace LLE Restrictions Weight Bearing Restrictions: Yes LUE Weight Bearing: Non weight bearing LLE Weight Bearing: Non weight bearing    Mobility  Bed Mobility Overal bed mobility: Needs Assistance Bed Mobility: Supine to Sit;Sit to Supine     Supine to sit: Min assist Sit to supine: Min assist   General bed mobility comments: Pt required assistance for LLE advancement  Transfers Overall transfer level: Needs assistance Equipment used: None Transfers: Sit to/from Stand(Parital sit to stand to scoot hips to the L in bed.) Sit to Stand: Min guard         General transfer comment: Cues for safety.  Ambulation/Gait Ambulation/Gait assistance: (unable)               Stairs             Wheelchair Mobility    Modified Rankin (Stroke  Patients Only)       Balance Overall balance assessment: Needs assistance Sitting-balance support: Single extremity supported Sitting balance-Leahy Scale: Fair       Standing balance-Leahy Scale: Poor                              Cognition Arousal/Alertness: Awake/alert Behavior During Therapy: WFL for tasks assessed/performed Overall Cognitive Status: Within Functional Limits for tasks assessed                                        Exercises General Exercises - Lower Extremity Ankle Circles/Pumps: AROM;Both;10 reps;Supine Quad Sets: AROM;10 reps;Supine;Left Short Arc Quad: AROM;Left;10 reps;Supine Long Arc Quad: AROM;Left;10 reps;Seated Heel Slides: Left;10 reps;AAROM;Seated Heel Slides Limitations: gentle ROM to L Hip ABduction/ADduction: AAROM;Left;10 reps;Supine Straight Leg Raises: Left;10 reps;AAROM;Supine    General Comments        Pertinent Vitals/Pain Pain Assessment: 0-10 Pain Score: 3  Pain Location: LLE knee and shin Pain Descriptors / Indicators: Grimacing;Guarding Pain Intervention(s): Monitored during session;Repositioned    Home Living                      Prior Function            PT Goals (current goals can now be found in the care plan section) Acute  Rehab PT Goals Patient Stated Goal: to go home  Potential to Achieve Goals: Fair Progress towards PT goals: Progressing toward goals    Frequency    Min 5X/week      PT Plan Current plan remains appropriate    Co-evaluation              AM-PAC PT "6 Clicks" Mobility   Outcome Measure  Help needed turning from your back to your side while in a flat bed without using bedrails?: A Little Help needed moving from lying on your back to sitting on the side of a flat bed without using bedrails?: A Little Help needed moving to and from a bed to a chair (including a wheelchair)?: A Little Help needed standing up from a chair using your arms  (e.g., wheelchair or bedside chair)?: A Lot Help needed to walk in hospital room?: Total Help needed climbing 3-5 steps with a railing? : Total 6 Click Score: 13    End of Session Equipment Utilized During Treatment: Gait belt Activity Tolerance: Patient tolerated treatment well Patient left: in bed;with call bell/phone within reach;with bed alarm set Nurse Communication: Mobility status PT Visit Diagnosis: Other abnormalities of gait and mobility (R26.89);Pain Pain - Right/Left: Left Pain - part of body: Arm;Leg     Time: 1130-1200 PT Time Calculation (min) (ACUTE ONLY): 30 min  Charges:  $Therapeutic Exercise: 8-22 mins $Therapeutic Activity: 8-22 mins                     Bonney Leitz , PTA Acute Rehabilitation Services Pager [phone] Office [phone]     [name] Artis Delay 08/04/2019, 3:03 PM

## 2019-08-04 NOTE — Discharge Summary (Signed)
Orthopaedic Trauma Service (OTS) Discharge Summary   Patient ID: Bradley Brown MRN: [id number] DOB/AGE: 12/04/1950 69 y.o.  Admit date: 07/26/2019 Discharge date: 08/04/2019  Admission Diagnoses: Fall Closed left bicondylar tibial plateau fracture Closed left radial neck fracture Left leg compartment syndrome  Discharge Diagnoses:  Principal Problem:   Closed bicondylar fracture of left tibial plateau Active Problems:   Fracture of radial neck, left, closed   Vitamin D deficiency   Compartment syndrome of left lower extremity (HCC), left lower leg   Past Medical History:  Diagnosis Date  . Closed bicondylar fracture of left tibial plateau 07/27/2019  . Compartment syndrome of left lower extremity (HCC), left lower leg 08/04/2019  . Fracture of radial neck, left, closed 08/01/2019  . Vitamin D deficiency 08/01/2019     Procedures Performed: 07/27/2019: Dr. Carola Frost Left radial head arthroplasty External fixation left tibial plateau fracture 4 compartment fasciotomy left leg, single lateral incision   07/31/2019- Dr. Carola Frost   1. ORIF OF LEFT BICONDYLAR TIBIAL PLATEAU FRACTURE  2. ARTHROTOMY WITH PARTIAL MENISCECTOMY LEFT LATERAL MENISCUS 3. REMOVAL OF EXTERNAL FIXATOR UNDER ANESTHESIA 4. LAYERED CLOSURE OF 20 CM WOUND LATERAL FASCIOTOMY   Discharged Condition: good  Hospital Course:  Patient is a 69 year old right-hand-dominant Spanish-speaking male who was admitted on 07/26/2019 after falling off the back of a truck.  Patient sustained a significant injury to his left upper and left lower extremity.  Due to the complexity of the injury the orthopedic trauma service was consulted for management.  Patient was taken to the operating room initially on 07/27/2019 to address his wounds.  Due to his significant swelling to his left leg external fixation was carried out.  Additionally he should evidence of compartment syndrome with compartmental pressures confirming this.  4  compartment fasciotomy was performed utilizing a single lateral incision.  We were able to proceed with definitive fixation of his left upper extremity which included left radial head arthroplasty.  After this initial procedure patient was transferred to the PACU for recovery of anesthesia and then transferred to the orthopedic floor for observation and pain control.  We are able to get a more detailed history of postop.  He does drink significant amount particularly on the weekends.  As such we did start him on alcohol withdrawal protocol in addition to ordering beers with his meal.  Patient did not have any issues during his hospital stay with respect to withdrawal symptoms.  Due to his lack of support at home patient was maintained in the inpatient setting.  We did reevaluate his soft tissue on Monday, 07/31/2019 and deemed that his soft tissue swelling had resolved and not to proceed with definitive fixation of his left tibial plateau fracture at the time of closure of his fasciotomy.  Patient tolerated the second procedure very well.  Once again he was transferred to the PACU for main anesthesia and then transferred to the orthopedic floor for observation, pain control and therapies.  Patient progressed very well during hospital stay.  There were no significant issues of note.  Patient was considered inpatient rehab candidate however due to financial concerns he declined this.  We initially thought that he may need to go to SNF but he was able to arrange for enough assistance to be able to discharge home.  Ultimately patient was deemed to be stable for discharge on 08/04/2019 to home with home health support as well as friend support.  During hospital stay patient was covered with Lovenox for DVT and  PE prophylaxis we did transition him to Xarelto 15 mg daily for ease of dosing as well as cost.  Patient's pain is adequately controlled with scheduled Tylenol, scheduled Robaxin and Norco  He was covered with  Ancef for perioperative antibiosis  Patient worked very well with therapies including OT and PT.  Dressings were changed on 08/04/2019 all of his surgical wounds including his external fixator pin sites look fantastic no evidence of infection no active draining.  He was fitted for a hinged knee brace after his second procedure.  It is unlocked to allow for full range of motion.  We did review range of motion exercises as well as his weightbearing restrictions.  He is nonweightbearing on his left upper and left lower extremity for the next 6 to 8 weeks.  He will be splinted with respect to his left arm for another 10 days at discharge and then will begin gentle range of motion.  He will not be permitted any lifting pushing or pulling with his left arm for about 6 weeks total. He has unrestricted range of motion of the left knee and ankle   Given the magnitude of his injury as well as his age we did check some basic metabolic bone labs which did confirm some vitamin D deficiency.  He was started on supplementation vitamin D and vitamin C.  These will be continued at discharge  Patient's discharge medications were sent to the transitions of care pharmacy here at the hospital   Consults: None  Significant Diagnostic Studies: labs:   Results for QUILL, GRINDER (MRN [id number]) as of 08/04/2019 11:26  Ref. Range 08/03/2019 02:13  Sodium Latest Ref Range: 135 - 145 mmol/L 135  Potassium Latest Ref Range: 3.5 - 5.1 mmol/L 3.8  Chloride Latest Ref Range: 98 - 111 mmol/L 98  CO2 Latest Ref Range: 22 - 32 mmol/L 26  Glucose Latest Ref Range: 70 - 99 mg/dL 741 (H)  BUN Latest Ref Range: 8 - 23 mg/dL 12  Creatinine Latest Ref Range: 0.61 - 1.24 mg/dL 6.38  Calcium Latest Ref Range: 8.9 - 10.3 mg/dL 8.6 (L)  Anion gap Latest Ref Range: 5 - 15  11  Alkaline Phosphatase Latest Ref Range: 38 - 126 U/L 47  Albumin Latest Ref Range: 3.5 - 5.0 g/dL 3.0 (L)  AST Latest Ref Range: 15 - 41 U/L 27  ALT Latest  Ref Range: 0 - 44 U/L 19  Total Protein Latest Ref Range: 6.5 - 8.1 g/dL 6.1 (L)  Total Bilirubin Latest Ref Range: 0.3 - 1.2 mg/dL 0.8  GFR, Est Non African American Latest Ref Range: >60 mL/min >60  GFR, Est African American Latest Ref Range: >60 mL/min >60  WBC Latest Ref Range: 4.0 - 10.5 K/uL 8.5  RBC Latest Ref Range: 4.22 - 5.81 MIL/uL 2.98 (L)  Hemoglobin Latest Ref Range: 13.0 - 17.0 g/dL 9.4 (L)  HCT Latest Ref Range: 39.0 - 52.0 % 28.2 (L)  MCV Latest Ref Range: 80.0 - 100.0 fL 94.6  MCH Latest Ref Range: 26.0 - 34.0 pg 31.5  MCHC Latest Ref Range: 30.0 - 36.0 g/dL 45.3  RDW Latest Ref Range: 11.5 - 15.5 % 12.4  Platelets Latest Ref Range: 150 - 400 K/uL 295  nRBC Latest Ref Range: 0.0 - 0.2 % 0.0  Results for JOBY, RICHART (MRN [id number]) as of 08/04/2019 11:26  Ref. Range 07/28/2019 04:02  Vit D, 1,25-Dihydroxy Latest Ref Range: 19.9 - 79.3 pg/mL 21.8  Vitamin D, 25-Hydroxy Latest Ref  Range: 30 - 100 ng/mL 8.28 (L)  Results for Bradley CopaGONZALEZ, Deny (MRN [id number]) as of 08/04/2019 11:26  Ref. Range 07/27/2019 05:27  HIV Screen 4th Generation wRfx Latest Ref Range: Non Reactive  Non Reactive    Status:  Final result Visible to patient:  No (inaccessible in MyChart) Next appt:  08/30/2019 at 02:30 PM in [facility](Enobong Newlin, MD) Specimen Information: Nasopharyngeal Swab      Ref Range & Units 9 d ago  SARS Coronavirus 2 NEGATIVE NEGATIVE          Treatments: IV hydration, antibiotics: Ancef, analgesia: acetaminophen, Dilaudid, robaxin and Norco, anticoagulation: LMW heparin (during inpatient stay) and Xarelto at discharge, therapies: PT, OT and RN and surgery: As above  Discharge Exam:                                Orthopaedic Trauma Service Progress Note   Patient ID: Bradley Brown MRN: [id number] DOB/AGE: [birth year] 69 y.o.   Subjective:   Doing well Pain controlled No acute or new issues Ready to go home    Utilized Status interpreter system  to review dc instructions and follow up      ROS As above   Objective:    VITALS:         Vitals:    08/03/19 2102 08/03/19 2139 08/04/19 0525 08/04/19 0750  BP: 132/61   119/65 120/68  Pulse: (!) 59 67 60 (!) 54  Resp: 17   18 16   Temp: 98.4 F (36.9 C)   98.3 F (36.8 C) 99.6 F (37.6 C)  TempSrc: Oral   Oral Oral  SpO2: 100%   99% 99%  Weight:          Height:              Estimated body mass index is 24.62 kg/m as calculated from the following:   Height as of this encounter: 5\' 6"  (1.676 m).   Weight as of this encounter: 69.2 kg.     Intake/Output      05/20 0701 - 05/21 0700 05/21 0701 - 05/22 0700   P.O. 720 240   I.V. (mL/kg)     IV Piggyback     Total Intake(mL/kg) 720 (10.4) 240 (3.5)   Urine (mL/kg/hr) 1500 (0.9) 500 (2.2)   Stool 0    Total Output 1500 500   Net -780 -260        Stool Occurrence 1 x       LABS   Lab Results Last 24 Hours  No results found for this or any previous visit (from the past 24 hour(s)).       PHYSICAL EXAM:    Gen: resting comfortably, NAD, watching TV Lungs: unlabored Cardiac: regular Abd: soft NT, +BS Ext:              Left Upper Extremity                          LAS fitting well                         Ext warm                          Good color distally  Radial, ulnar, median nv motor and sensory functions intact                         AIN, PIN motor intact                         Swelling well controlled                         No pain with passive stretch of digits                Left Lower Extremity                          dressings c/d/i                         Dressings removed                                     All wounds look great                                     Ex fix pinsites healing up nicely                                      New mepilex dressings applied                                      Hinged knee brace reapplied                                      Reviewed importance of not letting knee rest in flexion, has zero knee bone foam                          Ext warm                          + DP pulse                         EHL, FHL, lesser toe motor intact                         Ankle flexion, extension, inversion and eversion intact                         Swelling controlled                         No pain with passive stretching      Assessment/Plan: 4 Days Post-Op    Principal Problem:   Closed bicondylar fracture of left tibial plateau Active Problems:   Fracture of radial neck, left, closed   Vitamin D deficiency  Anti-infectives (From admission, onward)      Start     Dose/Rate Route Frequency Ordered Stop    07/31/19 1700   ceFAZolin (ANCEF) IVPB 2g/100 mL premix     2 g 200 mL/hr over 30 Minutes Intravenous Every 8 hours 07/31/19 1652 08/01/19 0912    07/27/19 1800   ceFAZolin (ANCEF) IVPB 1 g/50 mL premix     1 g 100 mL/hr over 30 Minutes Intravenous Every 6 hours 07/27/19 1716 07/28/19 0842    07/27/19 0600   ceFAZolin (ANCEF) IVPB 2g/100 mL premix     2 g 200 mL/hr over 30 Minutes Intravenous On call to O.R. 07/26/19 2344 07/27/19 1300       .   POD/HD#: 37   69 y/o male s/p fall off truck at work on 07/26/2019 with multiple ortho injuries    -fall off truck    - comminuted L bicondylar tibial plateau fracture with compartment syndrome s/p ex fix and fasciotomies              NWB L leg x 8 weeks             Unrestricted ROM L knee and ankle             PT/OT             Aggressive ice and elevation              Dressing changed today                          Change mepilex dressings again in 3-5 days                           PT- please teach HEP for L knee ROM- AROM, PROM. Prone exercises as well. No ROM restrictions.  Quad sets, SLR, LAQ, SAQ, heel slides, stretching, prone flexion and extension               Ankle theraband program, heel cord stretching, toe towel curls, etc                No pillows under bend of knee when at rest, ok to place under heel to help work on extension. Can also use zero knee bone foam if available               - comminuted L radial neck/head fracture s/p L radial head arthroplasty              NWB L UEx             Sling              Splint x 10 days then begin gentle ROM              Ice and elevate             Wrist and hand motion as tolerated                 - Pain management:             scheduled tylenol, robaxin              norco for breakthrough pain    - ABL anemia/Hemodynamics             Monitor    - Medical issues  Nicotine dependence                         Discussed risks of continued nicotine use                          No nicotine products (patches, gum, etc)               Vitamin d deficiency                          Supplement    - DVT/PE prophylaxis:             Lovenox x 30 days              Since plan is to dc home will convert to xarelto 15 mg po daily x 30 days                         Match program/copay card                 - ID:              periop abx completed    - Metabolic Bone Disease:             + vitamin d deficiency                         Supplement                  - Activity:             NWB L UEx and L LEx   - FEN/GI prophylaxis/Foley/Lines:             reg diet             NSL IV    - Impediments to fracture healing:             Nicotine use   - Dispo:             Dc home today              Follow up with ortho in 7-10 days    Disposition: Discharge disposition: 01-Home or Self Care       Discharge Instructions    Call MD / Call 911   Complete by: As directed    If you experience chest pain or shortness of breath, CALL 911 and be transported to the hospital emergency room.  If you develope a fever above 101 F, pus (white drainage) or increased drainage or redness at the wound, or calf pain, call your surgeon's office.   Constipation Prevention    Complete by: As directed    Drink plenty of fluids.  Prune juice may be helpful.  You may use a stool softener, such as Colace (over the counter) 100 mg twice a day.  Use MiraLax (over the counter) for constipation as needed.   Diet general   Complete by: As directed    Discharge instructions   Complete by: As directed    Orthopaedic Trauma Service Discharge Instructions   General Discharge Instructions  Orthopaedic Injuries:  Left radial neck fracture treated with left radial head arthroplasty             Left tibial plateau fracture treated with open reduction internal  fixation using plate and screws  WEIGHT BEARING STATUS: Nonweightbearing left leg and nonweightbearing right arm  RANGE OF MOTION/ACTIVITY: No motion of her left elbow at this time as you are splinted. Okay to move left shoulder and left hand. Unrestricted range of motion of left knee. Do not place pillows under the back of the knee when resting. Place under heel or ankle to keep knee fully extended at rest.  Bone health: Labs show vitamin D deficiency. Continue to take vitamin D and vitamin C  Wound Care: Do not remove splint from left arm. Start daily dressing changes to the left leg on 08/07/2019. Please see below. If wounds are completely dry you can leave them uncovered and open to the air.  Keep splint on left arm clean and dry do not get it wet  Discharge Wound Care Instructions  Do NOT apply any ointments, solutions or lotions to pin sites or surgical wounds.  These prevent needed drainage and even though solutions like hydrogen peroxide kill bacteria, they also damage cells lining the pin sites that help fight infection.  Applying lotions or ointments can keep the wounds moist and can cause them to breakdown and open up as well. This can increase the risk for infection. When in doubt call the office.  Surgical incisions should be dressed daily.  If any drainage is noted, use one layer of adaptic, then gauze,  Kerlix, and an ace wrap.  Once the incision is completely dry and without drainage, it may be left open to air out.  Showering may begin 36-48 hours later.  Cleaning gently with soap and water.  Traumatic wounds should be dressed daily as well.    One layer of adaptic, gauze, Kerlix, then ace wrap.  The adaptic can be discontinued once the draining has ceased    If you have a wet to dry dressing: wet the gauze with saline the squeeze as much saline out so the gauze is moist (not soaking wet), place moistened gauze over wound, then place a dry gauze over the moist one, followed by Kerlix wrap, then ace wrap.   DVT/PE prophylaxis: Xarelto 15 mg tablet daily x 30 days   Diet: as you were eating previously.  Can use over the counter stool softeners and bowel preparations, such as Miralax, to help with bowel movements.  Narcotics can be constipating.  Be sure to drink plenty of fluids  PAIN MEDICATION USE AND EXPECTATIONS  You have likely been given narcotic medications to help control your pain.  After a traumatic event that results in an fracture (broken bone) with or without surgery, it is ok to use narcotic pain medications to help control one's pain.  We understand that everyone responds to pain differently and each individual patient will be evaluated on a regular basis for the continued need for narcotic medications. Ideally, narcotic medication use should last no more than 6-8 weeks (coinciding with fracture healing).   As a patient it is your responsibility as well to monitor narcotic medication use and report the amount and frequency you use these medications when you come to your office visit.   We would also advise that if you are using narcotic medications, you should take a dose prior to therapy to maximize you participation.  IF YOU ARE ON NARCOTIC MEDICATIONS IT IS NOT PERMISSIBLE TO OPERATE A MOTOR VEHICLE (MOTORCYCLE/CAR/TRUCK/MOPED) OR HEAVY MACHINERY DO NOT MIX NARCOTICS WITH OTHER  CNS (CENTRAL NERVOUS SYSTEM) DEPRESSANTS SUCH AS ALCOHOL   STOP SMOKING  OR USING NICOTINE PRODUCTS!!!!  As discussed nicotine severely impairs your body's ability to heal surgical and traumatic wounds but also impairs bone healing.  Wounds and bone heal by forming microscopic blood vessels (angiogenesis) and nicotine is a vasoconstrictor (essentially, shrinks blood vessels).  Therefore, if vasoconstriction occurs to these microscopic blood vessels they essentially disappear and are unable to deliver necessary nutrients to the healing tissue.  This is one modifiable factor that you can do to dramatically increase your chances of healing your injury.    (This means no smoking, no nicotine gum, patches, etc)  DO NOT USE NONSTEROIDAL ANTI-INFLAMMATORY DRUGS (NSAID'S)  Using products such as Advil (ibuprofen), Aleve (naproxen), Motrin (ibuprofen) for additional pain control during fracture healing can delay and/or prevent the healing response.  If you would like to take over the counter (OTC) medication, Tylenol (acetaminophen) is ok.  However, some narcotic medications that are given for pain control contain acetaminophen as well. Therefore, you should not exceed more than 4000 mg of tylenol in a day if you do not have liver disease.  Also note that there are may OTC medicines, such as cold medicines and allergy medicines that my contain tylenol as well.  If you have any questions about medications and/or interactions please ask your doctor/PA or your pharmacist.      ICE AND ELEVATE INJURED/OPERATIVE EXTREMITY  Using ice and elevating the injured extremity above your heart can help with swelling and pain control.  Icing in a pulsatile fashion, such as 20 minutes on and 20 minutes off, can be followed.    Do not place ice directly on skin. Make sure there is a barrier between to skin and the ice pack.    Using frozen items such as frozen peas works well as the conform nicely to the are that needs to be  iced.  USE AN ACE WRAP OR TED HOSE FOR SWELLING CONTROL  In addition to icing and elevation, Ace wraps or TED hose are used to help limit and resolve swelling.  It is recommended to use Ace wraps or TED hose until you are informed to stop.    When using Ace Wraps start the wrapping distally (farthest away from the body) and wrap proximally (closer to the body)   Example: If you had surgery on your leg or thing and you do not have a splint on, start the ace wrap at the toes and work your way up to the thigh        If you had surgery on your upper extremity and do not have a splint on, start the ace wrap at your fingers and work your way up to the upper arm  IF YOU ARE IN A SPLINT OR CAST DO NOT REMOVE IT FOR ANY REASON   If your splint gets wet for any reason please contact the office immediately. You may shower in your splint or cast as long as you keep it dry.  This can be done by wrapping in a cast cover or garbage back (or similar)  Do Not stick any thing down your splint or cast such as pencils, money, or hangers to try and scratch yourself with.  If you feel itchy take benadryl as prescribed on the bottle for itching  IF YOU ARE IN A CAM BOOT (BLACK BOOT)  You may remove boot periodically. Perform daily dressing changes as noted below.  Wash the liner of the boot regularly and wear a sock when wearing the boot. It  is recommended that you sleep in the boot until told otherwise    Call office for the following: Temperature greater than 101F Persistent nausea and vomiting Severe uncontrolled pain Redness, tenderness, or signs of infection (pain, swelling, redness, odor or green/yellow discharge around the site) Difficulty breathing, headache or visual disturbances Hives Persistent dizziness or light-headedness Extreme fatigue Any other questions or concerns you may have after discharge  In an emergency, call 911 or go to an Emergency Department at a nearby hospital    CALL THE  OFFICE WITH ANY QUESTIONS OR CONCERNS: [phone]   VISIT OUR WEBSITE FOR ADDITIONAL INFORMATION: [email or web address]   Driving restrictions   Complete by: As directed    No driving for 9 weeks   Increase activity slowly as tolerated   Complete by: As directed    Lifting restrictions   Complete by: As directed    No lifting with left arm until further notice   Non weight bearing   Complete by: As directed    Laterality: left   Extremity: Both     Allergies as of 08/04/2019   No Known Allergies     Medication List    TAKE these medications   acetaminophen 500 MG tablet Commonly known as: TYLENOL Take 1 tablet (500 mg total) by mouth every 12 (twelve) hours.   ascorbic acid 1000 MG tablet Commonly known as: VITAMIN C Take 1 tablet (1,000 mg total) by mouth daily. Start taking on: Aug 05, 2019   docusate sodium 100 MG capsule Commonly known as: COLACE Take 1 capsule (100 mg total) by mouth 2 (two) times daily.   HYDROcodone-acetaminophen 7.5-325 MG tablet Commonly known as: NORCO Take 1-2 tablets by mouth every 6 (six) hours as needed for moderate pain or severe pain.   methocarbamol 500 MG tablet Commonly known as: ROBAXIN Take 1 tablet (500 mg total) by mouth every 6 (six) hours as needed for muscle spasms.   multivitamin Tabs tablet Take 1 tablet by mouth daily.   Rivaroxaban 15 MG Tabs tablet Commonly known as: XARELTO Take 1 tablet (15 mg total) by mouth daily. Start taking on: Aug 05, 2019   Vitamin D3 25 MCG tablet Commonly known as: Vitamin D Take 2 tablets (2,000 Units total) by mouth 2 (two) times daily. Start taking on: Aug 05, 2019            Durable Medical Equipment  (From admission, onward)         Start     Ordered   08/04/19 0936  For home use only DME 3 n 1  Once     08/04/19 0936   08/04/19 0935  For home use only DME lightweight manual wheelchair with seat cushion  Once    Comments: Patient suffers from left  tibial plateau  fracture, elbow fracture repair  which impairs their ability to perform daily activities like walking in the home.  A rolling walker will not resolve  issue with performing activities of daily living. A wheelchair will allow patient to safely perform daily activities. Patient is not able to propel themselves in the home using a standard weight wheelchair due to  left  tibial plateau fracture, elbow fracture repair . Patient can self propel in the lightweight wheelchair. Length of need 12 months. Accessories: elevating leg rests (ELRs), wheel locks, extensions and anti-tippers.   08/04/19 0936           Discharge Care Instructions  (From admission, onward)  Start     Ordered   08/04/19 0000  Non weight bearing    Question Answer Comment  Laterality left   Extremity Both      08/04/19 1108         Follow-up Information    [facility] Follow up on 08/30/2019.   Why: 2:30 pm, Dr. Charlott Rakes Contact information: [place] [id number] [phone]       Altamese [place], MD Follow up on 08/16/2019.   Specialty: Orthopedic Surgery Why: please call for a time  Contact information: [place] [address] [phone]           Discharge Instructions and Plan:  69 year old male s/p fall with left radial neck fracture and left tibial plateau fracture with acute compartment syndrome.  Left radial head arthroplasty, ORIF left tibial plateau and 4 compartment fasciotomies left leg  Weightbearing: NWB Left upper and left lower extremities Insicional and dressing care: Left arm splint is to be left in place until follow-up.  Patient may begin dressing changes to his left leg on 08/07/2019.  Wounds may be left open to the air if there is no drainage and he may begin showering once there is no drainage. Orthopedic device(s): Wheelchair and 3 in 1 commode Showering: Okay to shower once there is  no drainage.  Do not get splint on left arm wet VTE prophylaxis: Xarelto 15mg  Daily x30 days Pain control: Tylenol, Robaxin, Norco Bone Health/Optimization: Vitamin D 4000 IUs daily, vitamin C 500-1,000 mg daily Follow - up plan: 10 days Contact information:  [place] MD, Ainsley Spinner PA-C   Signed:  Jari Pigg, PA-C [phone] (C) 08/04/2019, 11:18 AM  [facility] [place] [address] [phone] Domingo Sep (F)

## 2019-08-04 NOTE — Progress Notes (Signed)
Occupational Therapy Treatment Patient Details Name: Bradley Brown MRN: [id number] DOB: March 15, 1951 Today's Date: 08/04/2019    History of present illness 69 y.o. male presented to ED 07/26/19 after falling off back of truck resulting in left  tibial plateau fracture, elbow fracture radial head comminuted. S/p external fixation L tibia 07/27/19. PMH none on file.   OT comments  Pt declined OOB mobility this session as pt anxiously awaiting DC. Pt going home although feel CIR more appropriate and safe DC plan. Session focus on education on DME uses and overall safety. Demo'ed set-up of 3n1 with pt verbalizing understanding. Pt reports w/c will fit into BR however provided education on using 3n1 as BSC to pivot from w/c>3n1. Pt plans to take sponge baths at home for bathing and verbalized dressing techniques. Will continue to follow acutely per POC.    Follow Up Recommendations  CIR;Supervision/Assistance - 24 hour    Equipment Recommendations  3 in 1 bedside commode;Tub/shower bench    Recommendations for Other Services      Precautions / Restrictions Precautions Precautions: Fall Required Braces or Orthoses: Sling;Splint/Cast Splint/Cast: L ORIF, LUE splint, hinged knee brace LLE Restrictions Weight Bearing Restrictions: Yes LUE Weight Bearing: Non weight bearing LLE Weight Bearing: Non weight bearing       Mobility Bed Mobility Overal bed mobility: Needs Assistance Bed Mobility: Supine to Sit;Sit to Supine     Supine to sit: Min assist Sit to supine: Min assist   General bed mobility comments: pt declined OOB mobility but noted pt able to adjust in bed with use of rails and no assist needed  Transfers Overall transfer level: Needs assistance Equipment used: None Transfers: Sit to/from Stand(Parital sit to stand to scoot hips to the L in bed.) Sit to Stand: Min guard         General transfer comment: pt declined    Balance Overall balance assessment: Needs  assistance Sitting-balance support: Single extremity supported Sitting balance-Leahy Scale: Fair       Standing balance-Leahy Scale: Poor                             ADL either performed or assessed with clinical judgement   ADL Overall ADL's : Needs assistance/impaired               Lower Body Bathing Details (indicate cue type and reason): pt reports doing sponge baths at home       Lower Body Dressing Details (indicate cue type and reason): pt able to verbalize LB Dressing strategy with hing brace   Toilet Transfer Details (indicate cue type and reason): pt declined OOB transfer this session           General ADL Comments: pt declined OOB mobility; session focus on set- up of 3n1 and its uses. Pt reports w/c will fit into BR at home however proivded education of pivoting from w/c >3n1 at home if needed     Vision       Perception     Praxis      Cognition Arousal/Alertness: Awake/alert Behavior During Therapy: [facility] for tasks assessed/performed Overall Cognitive Status: Within Functional Limits for tasks assessed                                 General Comments: pt slightly annoyed as pt anxiously awaiting DC Pt asking when is his ortho  f/u appt, directed pt to DC packet with pt verbalizing understanding.         Exercises   Shoulder Instructions       General Comments stratus interpreter utilized : Bradley Brown [phone]    Pertinent Vitals/ Pain       Pain Assessment: Faces Pain Score: 3  Faces Pain Scale: Hurts a little bit Pain Location: LLE knee and shin Pain Descriptors / Indicators: Grimacing;Guarding Pain Intervention(s): Monitored during session  Home Living                                          Prior Functioning/Environment              Frequency  Min 2X/week        Progress Toward Goals  OT Goals(current goals can now be found in the care plan section)  Progress towards OT goals:  Progressing toward goals  Acute Rehab OT Goals Patient Stated Goal: to go home  OT Goal Formulation: With patient Time For Goal Achievement: 08/11/19 Potential to Achieve Goals: Good  Plan Discharge plan remains appropriate    Co-evaluation                 AM-PAC OT "6 Clicks" Daily Activity     Outcome Measure   Help from another person eating meals?: A Little Help from another person taking care of personal grooming?: A Little Help from another person toileting, which includes using toliet, bedpan, or urinal?: A Lot Help from another person bathing (including washing, rinsing, drying)?: A Little Help from another person to put on and taking off regular upper body clothing?: A Little Help from another person to put on and taking off regular lower body clothing?: A Lot 6 Click Score: 16    End of Session Equipment Utilized During Treatment: Other (comment)(3n1)  OT Visit Diagnosis: Pain;Unsteadiness on feet (R26.81) Pain - Right/Left: Left Pain - part of body: Leg   Activity Tolerance Patient tolerated treatment well   Patient Left in bed;with call bell/phone within reach   Nurse Communication          Time: 3716-9678 OT Time Calculation (min): 11 min  Charges: OT General Charges $OT Visit: 1 Visit OT Treatments $Self Care/Home Management : 8-22 mins  Bradley Amel., COTA/L Acute Rehabilitation Services [phone]    Bradley Brown 08/04/2019, 3:09 PM

## 2019-08-04 NOTE — Discharge Instructions (Signed)
[facility] Discharge Instructions   General Discharge Instructions  Orthopaedic Injuries:  Left radial neck fracture treated with left radial head arthroplasty             Left tibial plateau fracture treated with open reduction internal fixation using plate and screws  WEIGHT BEARING STATUS: Nonweightbearing left leg and nonweightbearing right arm  RANGE OF MOTION/ACTIVITY: No motion of her left elbow at this time as you are splinted. Okay to move left shoulder and left hand. Unrestricted range of motion of left knee. Do not place pillows under the back of the knee when resting. Place under heel or ankle to keep knee fully extended at rest.  Bone health: Labs show vitamin D deficiency. Continue to take vitamin D and vitamin C  Wound Care: Do not remove splint from left arm. Start daily dressing changes to the left leg on 08/07/2019. Please see below. If wounds are completely dry you can leave them uncovered and open to the air.  Keep splint on left arm clean and dry do not get it wet  Discharge Wound Care Instructions  Do NOT apply any ointments, solutions or lotions to pin sites or surgical wounds.  These prevent needed drainage and even though solutions like hydrogen peroxide kill bacteria, they also damage cells lining the pin sites that help fight infection.  Applying lotions or ointments can keep the wounds moist and can cause them to breakdown and open up as well. This can increase the risk for infection. When in doubt call the office.  Surgical incisions should be dressed daily.  If any drainage is noted, use one layer of adaptic, then gauze, Kerlix, and an ace wrap.  Once the incision is completely dry and without drainage, it may be left open to air out.  Showering may begin 36-48 hours later.  Cleaning gently with soap and water.  Traumatic wounds should be dressed daily as well.    One layer of adaptic, gauze, Kerlix, then ace wrap.  The adaptic can be  discontinued once the draining has ceased    If you have a wet to dry dressing: wet the gauze with saline the squeeze as much saline out so the gauze is moist (not soaking wet), place moistened gauze over wound, then place a dry gauze over the moist one, followed by Kerlix wrap, then ace wrap.   DVT/PE prophylaxis: Xarelto 15 mg tablet daily x 30 days   Diet: as you were eating previously.  Can use over the counter stool softeners and bowel preparations, such as Miralax, to help with bowel movements.  Narcotics can be constipating.  Be sure to drink plenty of fluids  PAIN MEDICATION USE AND EXPECTATIONS  You have likely been given narcotic medications to help control your pain.  After a traumatic event that results in an fracture (broken bone) with or without surgery, it is ok to use narcotic pain medications to help control one's pain.  We understand that everyone responds to pain differently and each individual patient will be evaluated on a regular basis for the continued need for narcotic medications. Ideally, narcotic medication use should last no more than 6-8 weeks (coinciding with fracture healing).   As a patient it is your responsibility as well to monitor narcotic medication use and report the amount and frequency you use these medications when you come to your office visit.   We would also advise that if you are using narcotic medications, you should take a dose  prior to therapy to maximize you participation.  IF YOU ARE ON NARCOTIC MEDICATIONS IT IS NOT PERMISSIBLE TO OPERATE A MOTOR VEHICLE (MOTORCYCLE/CAR/TRUCK/MOPED) OR HEAVY MACHINERY DO NOT MIX NARCOTICS WITH OTHER CNS (CENTRAL NERVOUS SYSTEM) DEPRESSANTS SUCH AS ALCOHOL   STOP SMOKING OR USING NICOTINE PRODUCTS!!!!  As discussed nicotine severely impairs your body's ability to heal surgical and traumatic wounds but also impairs bone healing.  Wounds and bone heal by forming microscopic blood vessels (angiogenesis) and nicotine  is a vasoconstrictor (essentially, shrinks blood vessels).  Therefore, if vasoconstriction occurs to these microscopic blood vessels they essentially disappear and are unable to deliver necessary nutrients to the healing tissue.  This is one modifiable factor that you can do to dramatically increase your chances of healing your injury.    (This means no smoking, no nicotine gum, patches, etc)  DO NOT USE NONSTEROIDAL ANTI-INFLAMMATORY DRUGS (NSAID'S)  Using products such as Advil (ibuprofen), Aleve (naproxen), Motrin (ibuprofen) for additional pain control during fracture healing can delay and/or prevent the healing response.  If you would like to take over the counter (OTC) medication, Tylenol (acetaminophen) is ok.  However, some narcotic medications that are given for pain control contain acetaminophen as well. Therefore, you should not exceed more than 4000 mg of tylenol in a day if you do not have liver disease.  Also note that there are may OTC medicines, such as cold medicines and allergy medicines that my contain tylenol as well.  If you have any questions about medications and/or interactions please ask your doctor/PA or your pharmacist.      ICE AND ELEVATE INJURED/OPERATIVE EXTREMITY  Using ice and elevating the injured extremity above your heart can help with swelling and pain control.  Icing in a pulsatile fashion, such as 20 minutes on and 20 minutes off, can be followed.    Do not place ice directly on skin. Make sure there is a barrier between to skin and the ice pack.    Using frozen items such as frozen peas works well as the conform nicely to the are that needs to be iced.  USE AN ACE WRAP OR TED HOSE FOR SWELLING CONTROL  In addition to icing and elevation, Ace wraps or TED hose are used to help limit and resolve swelling.  It is recommended to use Ace wraps or TED hose until you are informed to stop.    When using Ace Wraps start the wrapping distally (farthest away from the body)  and wrap proximally (closer to the body)   Example: If you had surgery on your leg or thing and you do not have a splint on, start the ace wrap at the toes and work your way up to the thigh        If you had surgery on your upper extremity and do not have a splint on, start the ace wrap at your fingers and work your way up to the upper arm  IF YOU ARE IN A SPLINT OR CAST DO NOT [place]   If your splint gets wet for any reason please contact the office immediately. You may shower in your splint or cast as long as you keep it dry.  This can be done by wrapping in a cast cover or garbage back (or similar)  Do Not stick any thing down your splint or cast such as pencils, money, or hangers to try and scratch yourself with.  If you feel itchy take benadryl as  prescribed on the bottle for itching  IF YOU ARE IN A CAM BOOT (BLACK BOOT)  You may remove boot periodically. Perform daily dressing changes as noted below.  Wash the liner of the boot regularly and wear a sock when wearing the boot. It is recommended that you sleep in the boot until told otherwise    Call office for the following:  Temperature greater than 101F  Persistent nausea and vomiting  Severe uncontrolled pain  Redness, tenderness, or signs of infection (pain, swelling, redness, odor or green/yellow discharge around the site)  Difficulty breathing, headache or visual disturbances  Hives  Persistent dizziness or light-headedness  Extreme fatigue  Any other questions or concerns you may have after discharge  In an emergency, call 911 or go to an Emergency Department at a nearby hospital    CALL THE OFFICE WITH ANY QUESTIONS OR CONCERNS: [phone]   VISIT OUR WEBSITE FOR ADDITIONAL INFORMATION: [email or web address]     Cuidados del yeso o de la frula en los adultos (Cast or Splint Care, Adult) Los yesos y las frulas son soportes que se utilizan para proteger los [place] rotos y Management consultant. Un  yeso o una frula mantienen el hueso firme y en la posicin correcta [place] se [place]. Los yesos y las frulas tambin ayudan a Engineer, materials, la hinchazn y los calambres. Un yeso es un soporte duro que suele estar hecho de fibra de vidrio o plstico. Est hecho a la medida del cuerpo y ofrece ms proteccin que una frula. No se puede quitar y volver a Biochemist, clinical. Una frula es un soporte blando que suele estar hecho de tela y elstico. Se puede ajustar y quitar segn sea necesario. Es posible que necesite un yeso o una frula si:  Tiene un hueso roto.  Tiene una lesin de las partes blandas.  Debe evitar mover una parte del cuerpo lesionada (dejarla inmvil) despus de [place]. CMO CUIDAR UN YESO O UNA FRULA     Si tiene un yeso:  No introduzca nada adentro del yeso para rascarse la piel. Hacerlo aumentar el riesgo de provocar una infeccin.  Controle todos los [employer] piel de alrededor del yeso. Informe al mdico acerca de cualquier inquietud.  Puede aplicar una locin en la piel seca alrededor de los bordes del yeso. No aplique locin en la piel por debajo del yeso.  Mantenga el yeso limpio.  Si el yeso no es impermeable: ? No deje que se moje. ? Cbralo con un envoltorio hermtico cuando tome un bao de inmersin o [place]. Si tiene una frula:  sela como se lo haya indicado el mdico. Qutesela solamente como se lo haya indicado el mdico.  Afloje la frula si los dedos de las manos o de los pies se le entumecen, siente hormigueos o se le enfran y se tornan de Research officer, trade union.  Mantenga la frula limpia.  Si la frula no es impermeable: ? No deje que se moje. ? Cbrala con un envoltorio hermtico cuando tome un bao de inmersin o [place]. El bao  No tome baos de inmersin ni nade hasta que el mdico lo autorice. Pregntele al mdico si puede ducharse. Delle Reining solo le permitan tomar baos de [place].  Si el yeso o la frula no son impermeables, cbralos  con un envoltorio hermtico cuando tome un bao de inmersin o una ducha. Control del dolor, de la rigidez y de la hinchazn  [employer] dedos de la mano o del pie  con frecuencia para evitar la rigidez y reducir la hinchazn.  Cuando est sentado o acostado, eleve la zona de la lesin por encima del nivel del corazn. Seguridad  No apoye el peso del cuerpo sobre la extremidad [employer] que el mdico lo autorice.  Use muletas u otros dispositivos de [employer] se lo haya indicado el mdico. Instrucciones generales  No ejerza presin en ninguna parte del yeso o de la frula hasta que se hayan endurecido. Esto puede tardar varias horas.  Retome sus actividades normales como se lo haya indicado el mdico. Pregntele al mdico qu actividades son seguras para usted.  Tome los medicamentos de venta libre y los recetados solamente como se lo haya indicado el mdico.  Oceanographer a todas las visitas de control como se lo haya indicado el mdico. Esto es importante. SOLICITE ATENCIN MDICA SI:  El yeso o la frula se daan.  La piel alrededor del yeso se enrojece o est en carne viva.  La piel debajo del yeso le pica o le duele mucho.  El yeso o la frula se sienten muy incmodos.  Siente que el yeso o la frula estn muy apretados o muy flojos.  El yeso se moja o tiene una zona blanda.  Algn objeto se queda atascado bajo el yeso. SOLICITE ATENCIN MDICA DE INMEDIATO SI:  El dolor empeora.  Siente hormigueo o entumecimiento en la zona lesionada, o esta se le enfra o se torna de color azul.  La parte del cuerpo por encima o por debajo del yeso est hinchada o tiene manchas.  No puede mover ni sentir los dedos.  Le sale una secrecin por el yeso.  Siente un dolor o presin intensos debajo del yeso.  Tiene dificultad para respirar.  Le falta el aire.  Siente dolor en el pecho. Esta informacin no tiene Theme park manager el consejo del mdico. Asegrese de hacerle al  mdico cualquier pregunta que tenga. Document Revised: 04/13/2016 Document Reviewed: 08/24/2015 Elsevier Patient Education  2020 [employer].

## 2019-08-30 ENCOUNTER — Other Ambulatory Visit: Payer: Self-pay

## 2019-08-30 ENCOUNTER — Ambulatory Visit: Payer: Self-pay | Attending: Family Medicine | Admitting: Family Medicine

## 2019-08-30 ENCOUNTER — Encounter: Payer: Self-pay | Admitting: Family Medicine

## 2019-08-30 VITALS — BP 115/66 | HR 72

## 2019-08-30 DIAGNOSIS — S52135A Nondisplaced fracture of neck of left radius, initial encounter for closed fracture: Secondary | ICD-10-CM

## 2019-08-30 DIAGNOSIS — S82142A Displaced bicondylar fracture of left tibia, initial encounter for closed fracture: Secondary | ICD-10-CM

## 2019-08-30 NOTE — Progress Notes (Signed)
Patient has no concerns today. 

## 2019-08-30 NOTE — Patient Instructions (Addendum)
Fractura de la tibia, en adultos Tibial Fracture, Adult  La fractura de la tibia es la rotura del hueso ms grande que est en la parte inferior de la pierna (tibia). Este hueso tambin se conoce como "canilla". Cules son las causas? La causa de esta afeccin es una lesin en la pierna ocasionada por:  Ardelia Mems cada.  Deportes.  Un accidente automovilstico. Cules son los signos o los sntomas? La pierna puede tener una apariencia anormal. Es posible que:  Tenga dolor.  Tenga hinchazn.  Tenga moretones.  No pueda caminar.  Tenga adormecimiento o una sensacin de hormigueo en el pie. Cmo se diagnostica? Su mdico:  Revisar sus sntomas y antecedentes mdicos.  Le har un examen fsico. Tambin pueden hacerle otras pruebas, por ejemplo:  Radiografas.  Una exploracin por tomografa computarizada (TC).  Resonancia magntica (RM). Cmo se trata? Los tratamientos [employer] lo siguiente:  El uso de un yeso, una frula o un dispositivo ortopdico hasta que el hueso se haya curado.  Usar [employer] segn las indicaciones del mdico. Esto ayuda a no apoyar peso sobre la pierna.  Realizar ejercicios (fisioterapia). Deber comenzar despus de que le quiten el yeso, la frula o el dispositivo ortopdico. Si la lesin caus que partes del hueso se desplazaran fuera de Environmental consultant, su mdico puede:  Health and safety inspector a Environmental education officer en su lugar antes de Dispensing optician yeso, la frula o el dispositivo ortopdico.  Sherrye Payor ciruga para colocar vstagos de metal, placas o tornillos en el hueso para Air cabin crew. Siga estas indicaciones en su casa: Si tiene una frula o un dispositivo ortopdico:  Use la frula o el dispositivo ortopdico como se lo haya indicado el mdico. Qutesela solamente como se lo haya indicado el mdico.  Afloje la frula o el dispositivo ortopdico si siente hormigueo en los dedos del pie, si se le entumecen, o se le enfran y se  tornan de Optician, dispensing.  Mantenga la frula o el dispositivo ortopdico limpios y secos. Si tiene un yeso:  No introduzca nada dentro del yeso para rascarse la piel.  Ralls piel de alrededor del yeso. Informe al mdico si tiene alguna inquietud.  Puede aplicar una locin en la piel seca alrededor de los bordes del yeso.  No aplique locin en la piel por debajo del yeso.  Mantenga el yeso seco y limpio. Baarse  No tome baos de inmersin, no practique natacin ni use el jacuzzi hasta que el mdico lo autorice. Pregntele al mdico si puede ducharse. Thurston Pounds solo le permitan darse baos de [place].  Si la frula, el dispositivo 42 o el yeso no es impermeable. ? No deje que se moje. ? Cbralo con un envoltorio hermtico cuando tome un bao de inmersin o [place]. Control del dolor, la rigidez y la hinchazn   Aplique hielo sobre la zona lesionada, si se lo indican: ? Si tiene una frula desmontable, qutesela como se lo haya indicado el mdico. ? Ponga el hielo en una bolsa plstica. ? Coloque una [employer] piel y la bolsa de hielo. ? Coloque el hielo durante 67minutos, 2 a 3veces por da.  Mueva los dedos del pie con frecuencia para evitar que se entumezcan y para reducir la hinchazn.  Mientras est sentado o acostado, levante (eleve) la parte inferior de la pierna por encima del nivel del corazn. Actividad  No apoye el peso del cuerpo NIKE pierna, hasta tanto el mdico lo  apruebe.  Utilice las [employer] le haya indicado el mdico.  Pregntele al mdico qu actividades son seguras para usted Engineer, manufacturing systems se recupera.  Evite las actividades de acuerdo con lo que le indique su mdico.  Haga los ejercicios como se lo haya indicado el mdico. Conducir  No conduzca hasta que el mdico le diga que es seguro [place].  No conduzca ni use maquinaria pesada mientras toma analgsicos recetados. Medicamentos  Pregntele al mdico si debe  tomar suplementos de calcio y vitaminasD para ayudar a que los [employer].  Tome los medicamentos de venta libre y los recetados solamente como se lo haya indicado el mdico.  A fin de Automotive engineer o tratar los problemas para Advertising copywriter (estreimiento) mientras toma analgsicos recetados, el mdico puede indicarle que: ? Beba suficiente lquido para Pharmacologist el pis (orina) de color amarillo plido. ? Tome medicamentos. ? Consuma alimentos ricos en fibra, como frutas y verduras frescas, cereales integrales y frijoles. ? Limite el consumo de alimentos ricos en grasa y azcares, como los alimentos fritos o dulces. Instrucciones generales  No ejerza presin en ninguna parte del yeso o de la frula hasta que se hayan endurecido por completo. Esto puede tardar muchas horas.  No consuma ningn producto que contenga nicotina o tabaco, como cigarrillos y Administrator, Civil Service. Estos pueden retrasar la consolidacin del [place]. Si necesita ayuda para dejar de fumar, consulte al mdico.  Concurra a todas las visitas de seguimiento como se lo haya indicado el mdico. Esto es importante. Comunquese con un mdico si tiene:  Un dolor que empeora o que no mejora con los medicamentos.  Enrojecimiento o hinchazn que empeora.  Adormecimiento u hormigueo en los dedos del pie o en el pie. Solicite ayuda de inmediato si:  El pie o los dedos del pie se enfran o se vuelven de color azul, incluso despus de aflojar la frula.  Siente Scientific laboratory technician. Resumen  La fractura de la tibia es la rotura del hueso ms grande que est en la parte inferior de la pierna.  Tendr que usar un yeso, una frula o un dispositivo ortopdico hasta que el hueso sane.  Utilice las [employer] le haya indicado el mdico.  Pregntele al mdico qu actividades son seguras para usted Engineer, manufacturing systems se recupera. Esta informacin no tiene Theme park manager el consejo del mdico. Asegrese de hacerle al mdico cualquier pregunta que  tenga. Document Revised: 05/12/2017 Document Reviewed: 05/12/2017 Elsevier Patient Education  2020 [employer].

## 2019-08-30 NOTE — Progress Notes (Signed)
Established Patient Office Visit  Subjective:  Patient ID: Bradley Brown, male    DOB: 01-13-1951  Age: 69 y.o. MRN: [id number]  CC:  Chief Complaint  Patient presents with  . Hospitalization Follow-up    HPI Bradley Brown is a 69 yo male with no known past medical history that comes to the clinic today for a hospital follow up post-operative L.Tibial ORIF and L.radial arthroplasty after a fall from his truck last month. Bradley Brown states that he saw his orthopedic surgeon Altamese [place]) on 06/02/21and this morning for follow up and suture removal and informed that there were no complications.He is still under care of his orthopedic surgeon. He reports no complaints and continues to do the recommended at-home physical therapy exercises.   He is currently taking Xarelto 15mg  (Rivoroxaban) for DVT prophylaxis post-surgery and taking Hydrocodone-Acetaminophen 7.5-325mg  PRN. He states he does not feel much pain, and rarely needs to use his pain medication.  He does not have any other concerns for today's visit.  He will schedule a follow-up for primary care and health maintanance.   Past Medical History:  Diagnosis Date  . Closed bicondylar fracture of left tibial plateau 07/27/2019  . Compartment syndrome of left lower extremity ([place]), left lower leg 08/04/2019  . Fracture of radial neck, left, closed 08/01/2019  . Vitamin D deficiency 08/01/2019    Past Surgical History:  Procedure Laterality Date  . APPLICATION OF WOUND VAC Left 07/27/2019   Procedure: Application Of Wound Vac;  Surgeon: Altamese [place], MD;  Location: [facility];  Service: Orthopedics;  Laterality: Left;  . EXTERNAL FIXATION LEG Left 07/27/2019   Procedure: EXTERNAL FIXATION LEFT TIBIA PLATEAU;  Surgeon: Altamese [place], MD;  Location: [facility];  Service: Orthopedics;  Laterality: Left;  . EXTERNAL FIXATOR AND ARCH BAR REMOVAL Left 07/31/2019   Procedure: External Fixator And Arch Bars Removal;  Surgeon: Altamese Echo,  MD;  Location: [facility];  Service: Orthopedics;  Laterality: Left;  . FASCIOTOMY Left 07/27/2019   Procedure: Fasciotomy; lateral aspect;  Surgeon: Altamese [place], MD;  Location: [facility];  Service: Orthopedics;  Laterality: Left;  . ORIF ELBOW FRACTURE Left 07/27/2019   Procedure: REPLACE REPAIR LEFT RADIAL HEAD;  Surgeon: Altamese Carte[place], MD;  Location: [facility];  Service: Orthopedics;  Laterality: Left;  . ORIF TIBIA PLATEAU Left 07/31/2019   Procedure: Open Reduction Internal Fixation (Orif) Tibial Plateau;  Surgeon: Altamese Denning, MD;  Location: [facility];  Service: Orthopedics;  Laterality: Left;  . SECONDARY CLOSURE OF WOUND Left 07/31/2019   Procedure: SECONDARY CLOSURE OF WOUND;  Surgeon: Altamese [place], MD;  Location: [facility];  Service: Orthopedics;  Laterality: Left;  . TENDON REPAIR Right 2016    History reviewed. No pertinent family history.  Social History   Socioeconomic History  . Marital status: Single    Spouse name: Not on file  . Number of children: Not on file  . Years of education: Not on file  . Highest education level: Not on file  Occupational History  . Not on file  Tobacco Use  . Smoking status: Current Every Day Smoker    Packs/day: 0.50  . Smokeless tobacco: Never Used  Substance and Sexual Activity  . Alcohol use: Yes    Alcohol/week: 18.0 standard drinks    Types: 18 Cans of beer per week  . Drug use: Never  . Sexual activity: Not on file  Other Topics Concern  . Not on file  Social History Narrative  . Not on  file   Social Determinants of Health   Financial Resource Strain:   . Difficulty of Paying Living Expenses:   Food Insecurity:   . Worried About Programme researcher, broadcasting/film/video in the Last Year:   . Barista in the Last Year:   Transportation Needs:   . Freight forwarder (Medical):   Marland Kitchen Lack of Transportation (Non-Medical):   Physical Activity:   . Days of Exercise per Week:   . Minutes of Exercise per Session:   Stress:   . Feeling of Stress :     Social Connections:   . Frequency of Communication with Friends and Family:   . Frequency of Social Gatherings with Friends and Family:   . Attends Religious Services:   . Active Member of Clubs or Organizations:   . Attends Banker Meetings:   Marland Kitchen Marital Status:   Intimate Partner Violence:   . Fear of Current or Ex-Partner:   . Emotionally Abused:   Marland Kitchen Physically Abused:   . Sexually Abused:     Outpatient Medications Prior to Visit  Medication Sig Dispense Refill  . acetaminophen (TYLENOL) 500 MG tablet Take 1 tablet (500 mg total) by mouth every 12 (twelve) hours. 60 tablet 0  . ascorbic acid (VITAMIN C) 1000 MG tablet Take 1 tablet (1,000 mg total) by mouth daily. 60 tablet 0  . cholecalciferol (VITAMIN D) 25 MCG tablet Take 2 tablets (2,000 Units total) by mouth 2 (two) times daily. 120 tablet 2  . docusate sodium (COLACE) 100 MG capsule Take 1 capsule (100 mg total) by mouth 2 (two) times daily. 20 capsule 0  . multivitamin (ONE-A-DAY MEN'S) TABS tablet Take 1 tablet by mouth daily.    . Rivaroxaban (XARELTO) 15 MG TABS tablet Take 1 tablet (15 mg total) by mouth daily. 30 tablet 0  . HYDROcodone-acetaminophen (NORCO) 7.5-325 MG tablet Take 1-2 tablets by mouth every 6 (six) hours as needed for moderate pain or severe pain. (Patient not taking: Reported on 08/30/2019) 50 tablet 0  . methocarbamol (ROBAXIN) 500 MG tablet Take 1 tablet (500 mg total) by mouth every 6 (six) hours as needed for muscle spasms. (Patient not taking: Reported on 08/30/2019) 60 tablet 0   No facility-administered medications prior to visit.    No Known Allergies  ROS Review of Systems  Constitutional: Negative for chills, fatigue and fever.  HENT: Negative.   Eyes: Negative.   Respiratory: Negative for cough, chest tightness and shortness of breath.   Cardiovascular: Negative for chest pain and leg swelling.  Gastrointestinal: Negative for constipation, diarrhea, nausea and vomiting.   Musculoskeletal: Negative for myalgias.  Skin: Negative for rash and wound.  Neurological: Negative for weakness, numbness and headaches.  Hematological: Does not bruise/bleed easily.      Objective:    Physical Exam Constitutional:      General: He is not in acute distress.    Appearance: Normal appearance.  HENT:     Head: Normocephalic and atraumatic.     Nose: Nose normal.  Eyes:     Conjunctiva/sclera: Conjunctivae normal.  Cardiovascular:     Rate and Rhythm: Normal rate and regular rhythm.     Pulses: Normal pulses.     Heart sounds: Normal heart sounds. No murmur heard.  No friction rub. No gallop.   Pulmonary:     Effort: Pulmonary effort is normal. No respiratory distress.     Breath sounds: Normal breath sounds. No wheezing.  Abdominal:  General: Bowel sounds are normal. There is no distension.     Palpations: Abdomen is soft. There is no mass.     Tenderness: There is no abdominal tenderness.  Musculoskeletal:        General: No swelling. Normal range of motion.     Right elbow: Normal.     Left elbow: Normal range of motion. Tenderness (mild tenderness to TTP over surgical scar) present.     Cervical back: Normal range of motion and neck supple.     Right knee: Normal. Normal range of motion. No tenderness.     Left knee: Swelling (mild) present. Normal range of motion. No tenderness.  Skin:    General: Skin is warm and dry.     Comments: Well-healing surgical scar over L.elbow and forearm Well-healing surgical scar over L. Lateral leg  Neurological:     General: No focal deficit present.     Mental Status: He is alert and oriented to person, place, and time.  Psychiatric:        Mood and Affect: Mood normal.        Behavior: Behavior normal.        Thought Content: Thought content normal.        Judgment: Judgment normal.     BP 115/66   Pulse 72   SpO2 98%  Wt Readings from Last 3 Encounters:  07/27/19 152 lb 8.9 oz (69.2 kg)     Health  Maintenance Due  Topic Date Due  . Hepatitis C Screening  Never done  . COVID-19 Vaccine (1) Never done  . TETANUS/TDAP  Never done  . COLONOSCOPY  Never done  . PNA vac Low Risk Adult (1 of 2 - PCV13) Never done    There are no preventive care reminders to display for this patient.  No results found for: TSH Lab Results  Component Value Date   WBC 8.5 08/03/2019   HGB 9.4 (L) 08/03/2019   HCT 28.2 (L) 08/03/2019   MCV 94.6 08/03/2019   PLT 295 08/03/2019   Lab Results  Component Value Date   NA 135 08/03/2019   K 3.8 08/03/2019   CO2 26 08/03/2019   GLUCOSE 114 (H) 08/03/2019   BUN 12 08/03/2019   CREATININE 0.73 08/03/2019   BILITOT 0.8 08/03/2019   ALKPHOS 47 08/03/2019   AST 27 08/03/2019   ALT 19 08/03/2019   PROT 6.1 (L) 08/03/2019   ALBUMIN 3.0 (L) 08/03/2019   CALCIUM 8.6 (L) 08/03/2019   ANIONGAP 11 08/03/2019   No results found for: CHOL No results found for: HDL No results found for: LDLCALC No results found for: TRIG No results found for: CHOLHDL No results found for: OVFI4P    Assessment & Plan:   Problem List Items Addressed This Visit      Musculoskeletal and Integument   Closed bicondylar fracture of left tibial plateau   Fracture of radial neck, left, closed - Primary  Controlled.  Patient is being managed by orthopedics and his healing well with no complications or complaints. He is rarely using pain medication and is compliant with at home physical therapy exercises. Continue with follow-up visits with Orthopedist.  Continue taking Xarelto for DVT prophylaxis for 30 days until 09/05/19. No refill needed. Continue taking pain medication PRN. Will schedule future visit for primary care.       No orders of the defined types were placed in this encounter.   Follow-up: Return in about 2 months (around 10/30/2019)  for complete physical exam.    Doylene Canard, Medical Student   Evaluation and management procedures were performed by me  with Medical Student in attendance, note written by Medical Student under my supervision and collaboration. I have reviewed the note and I agree with the management and plan.  Bradley Brown presents today to establish care after left radial and left tibial plateau fracture status post left radial arthroplasty and ORIF.  He has followed up with his orthopedic surgeon, sutures have been removed and his wound is healing well.  He will be commencing physical therapy soon but at this time is limited to his wheelchair for mobility. Once his acute issues are over he will return for a complete physical exam and fasting labs.  He has no additional concerns at this time.    Hoy Register, MD, FAAFP. [facility] [place], [place] [phone]   08/30/2019, 5:19 PM

## 2019-10-30 ENCOUNTER — Inpatient Hospital Stay: Payer: Self-pay | Admitting: Family Medicine

## 2019-10-30 ENCOUNTER — Ambulatory Visit: Payer: Self-pay | Attending: Family Medicine | Admitting: Family Medicine

## 2019-10-30 ENCOUNTER — Encounter: Payer: Self-pay | Admitting: Family Medicine

## 2019-10-30 ENCOUNTER — Other Ambulatory Visit: Payer: Self-pay

## 2019-10-30 VITALS — BP 130/79 | HR 71 | Ht 66.0 in | Wt 141.0 lb

## 2019-10-30 DIAGNOSIS — Z0000 Encounter for general adult medical examination without abnormal findings: Secondary | ICD-10-CM

## 2019-10-30 DIAGNOSIS — Z13228 Encounter for screening for other metabolic disorders: Secondary | ICD-10-CM

## 2019-10-30 DIAGNOSIS — Z1159 Encounter for screening for other viral diseases: Secondary | ICD-10-CM

## 2019-10-30 NOTE — Progress Notes (Signed)
Subjective:  Patient ID: Bradley Brown, male    DOB: 23-Mar-1950  Age: 69 y.o. MRN: [id number]  CC: Annual Exam   HPI Bradley Brown  is a 69 yo male status post-operative L.Tibial ORIF and L.radial arthroplasty after a fall from his truck who presents today for an annual physical exam. He declines Colon cancer screen even though I have explained to him the need for either a colonoscopy or stool FIT test.  Past Medical History:  Diagnosis Date  . Closed bicondylar fracture of left tibial plateau 07/27/2019  . Compartment syndrome of left lower extremity ([place]), left lower leg 08/04/2019  . Fracture of radial neck, left, closed 08/01/2019  . Vitamin D deficiency 08/01/2019    Past Surgical History:  Procedure Laterality Date  . APPLICATION OF WOUND VAC Left 07/27/2019   Procedure: Application Of Wound Vac;  Surgeon: Altamese [place], MD;  Location: [facility];  Service: Orthopedics;  Laterality: Left;  . EXTERNAL FIXATION LEG Left 07/27/2019   Procedure: EXTERNAL FIXATION LEFT TIBIA PLATEAU;  Surgeon: Altamese K[place]or[place], MD;  Location: [facility];  Service: Orthopedics;  Laterality: Left;  . EXTERNAL FIXATOR AND ARCH BAR REMOVAL Left 07/31/2019   Procedure: External Fixator And Arch Bars Removal;  Surgeon: Altamese [place], MD;  Location: [facility];  Service: Orthopedics;  Laterality: Left;  . FASCIOTOMY Left 07/27/2019   Procedure: Fasciotomy; lateral aspect;  Surgeon: Altamese [place], MD;  Location: [facility];  Service: Orthopedics;  Laterality: Left;  . ORIF ELBOW FRACTURE Left 07/27/2019   Procedure: REPLACE REPAIR LEFT RADIAL HEAD;  Surgeon: Altamese [place]te, MD;  Location: [facility];  Service: Orthopedics;  Laterality: Left;  . ORIF TIBIA PLATEAU Left 07/31/2019   Procedure: Open Reduction Internal Fixation (Orif) Tibial Plateau;  Surgeon: Altamese [place], MD;  Location: [facility];  Service: Orthopedics;  Laterality: Left;  . SECONDARY CLOSURE OF WOUND Left 07/31/2019   Procedure: SECONDARY CLOSURE OF WOUND;  Surgeon: Altamese , MD;  Location: [facility];  Service: Orthopedics;  Laterality: Left;  . TENDON REPAIR Right 2016    History reviewed. No pertinent family history.  No Known Allergies  Outpatient Medications Prior to Visit  Medication Sig Dispense Refill  . ascorbic acid (VITAMIN C) 1000 MG tablet Take 1 tablet (1,000 mg total) by mouth daily. 60 tablet 0  . cholecalciferol (VITAMIN D) 25 MCG tablet Take 2 tablets (2,000 Units total) by mouth 2 (two) times daily. 120 tablet 2  . docusate sodium (COLACE) 100 MG capsule Take 1 capsule (100 mg total) by mouth 2 (two) times daily. 20 capsule 0  . multivitamin (ONE-A-DAY MEN'S) TABS tablet Take 1 tablet by mouth daily.    Marland Kitchen acetaminophen (TYLENOL) 500 MG tablet Take 1 tablet (500 mg total) by mouth every 12 (twelve) hours. (Patient not taking: Reported on 10/30/2019) 60 tablet 0  . HYDROcodone-acetaminophen (NORCO) 7.5-325 MG tablet Take 1-2 tablets by mouth every 6 (six) hours as needed for moderate pain or severe pain. (Patient not taking: Reported on 08/30/2019) 50 tablet 0  . methocarbamol (ROBAXIN) 500 MG tablet Take 1 tablet (500 mg total) by mouth every 6 (six) hours as needed for muscle spasms. (Patient not taking: Reported on 08/30/2019) 60 tablet 0  . Rivaroxaban (XARELTO) 15 MG TABS tablet Take 1 tablet (15 mg total) by mouth daily. 30 tablet 0   No facility-administered medications prior to visit.     ROS Review of Systems  Constitutional: Negative for activity change and appetite change.  HENT: Negative for  sinus pressure and sore throat.   Eyes: Negative for visual disturbance.  Respiratory: Negative for cough, chest tightness and shortness of breath.   Cardiovascular: Negative for chest pain and leg swelling.  Gastrointestinal: Negative for abdominal distention, abdominal pain, constipation and diarrhea.  Endocrine: Negative.   Genitourinary: Negative for dysuria.  Musculoskeletal:       See HPI  Skin: Negative for rash.    Allergic/Immunologic: Negative.   Neurological: Negative for weakness, light-headedness and numbness.  Psychiatric/Behavioral: Negative for dysphoric mood and suicidal ideas.    Objective:  BP 130/79   Pulse 71   Ht 5' 6"  (1.676 m)   Wt 141 lb (64 kg)   SpO2 98%   BMI 22.76 kg/m   BP/Weight 10/30/2019 08/30/2019 7/48/2707  Systolic BP 867 544 920  Diastolic BP 79 66 68  Wt. (Lbs) 141 - -  BMI 22.76 - -      Physical Exam Constitutional:      Appearance: He is well-developed.  HENT:     Head: Normocephalic and atraumatic.     Right Ear: External ear normal.     Left Ear: External ear normal.  Eyes:     Conjunctiva/sclera: Conjunctivae normal.     Pupils: Pupils are equal, round, and reactive to light.  Neck:     Trachea: No tracheal deviation.  Cardiovascular:     Rate and Rhythm: Normal rate and regular rhythm.     Heart sounds: Normal heart sounds. No murmur heard.   Pulmonary:     Effort: Pulmonary effort is normal. No respiratory distress.     Breath sounds: Normal breath sounds. No wheezing.  Chest:     Chest wall: No tenderness.  Abdominal:     General: Bowel sounds are normal.     Palpations: Abdomen is soft. There is no mass.     Tenderness: There is no abdominal tenderness.  Musculoskeletal:        General: Tenderness (L leg) present.     Cervical back: Normal range of motion and neck supple.  Skin:    General: Skin is warm and dry.  Neurological:     Mental Status: He is alert and oriented to person, place, and time.     Gait: Gait abnormal.     CMP Latest Ref Rng & Units 08/03/2019 07/28/2019 07/26/2019  Glucose 70 - 99 mg/dL 114(H) 127(H) 96  BUN 8 - 23 mg/dL 12 9 13   Creatinine 0.61 - 1.24 mg/dL 0.73 0.77 0.85  Sodium 135 - 145 mmol/L 135 136 136  Potassium 3.5 - 5.1 mmol/L 3.8 3.9 4.2  Chloride 98 - 111 mmol/L 98 104 104  CO2 22 - 32 mmol/L 26 25 24   Calcium 8.9 - 10.3 mg/dL 8.6(L) 8.4(L) 8.9  Total Protein 6.5 - 8.1 g/dL 6.1(L) 5.9(L) -   Total Bilirubin 0.3 - 1.2 mg/dL 0.8 0.6 -  Alkaline Phos 38 - 126 U/L 47 47 -  AST 15 - 41 U/L 27 24 -  ALT 0 - 44 U/L 19 13 -    Lipid Panel  No results found for: CHOL, TRIG, HDL, CHOLHDL, VLDL, LDLCALC, LDLDIRECT  CBC    Component Value Date/Time   WBC 8.5 08/03/2019 0213   RBC 2.98 (L) 08/03/2019 0213   HGB 9.4 (L) 08/03/2019 0213   HCT 28.2 (L) 08/03/2019 0213   PLT 295 08/03/2019 0213   MCV 94.6 08/03/2019 0213   MCH 31.5 08/03/2019 0213   MCHC 33.3 08/03/2019 0213  RDW 12.4 08/03/2019 0213   LYMPHSABS 1.3 07/26/2019 1655   MONOABS 0.7 07/26/2019 1655   EOSABS 0.3 07/26/2019 1655   BASOSABS 0.0 07/26/2019 1655    No results found for: HGBA1C  Assessment & Plan:   1. Annual physical exam Counseled on 150 minutes of exercise per week, healthy eating (including decreased daily intake of saturated fats, cholesterol, added sugars, sodium), STI prevention, routine healthcare maintenance. Declines colon cancer screening - CBC with Differential/Platelet  2. Need for hepatitis C screening test - HCV RNA quant rflx ultra or genotyp([facility])  3. Screening for metabolic disorder - Lipid panel - CMP14+EGFR        Charlott Rakes, MD, FAAFP. [facility], [place]   10/30/2019, 12:48 PM

## 2019-10-30 NOTE — Patient Instructions (Signed)
Mantenimiento de la salud despus de los 65 aos de edad Health Maintenance After Age 67 Despus de los 65 aos de edad, corre un riesgo mayor de padecer ciertas enfermedades e infecciones a largo plazo, como tambin de sufrir lesiones por cadas. Las cadas son la causa principal de las fracturas de huesos y lesiones en la cabeza de personas mayores de 65 aos de edad. Recibir cuidados preventivos de forma regular puede ayudarlo a mantenerse saludable y en buen estado. Los cuidados preventivos incluyen realizarse anlisis de forma regular y realizar cambios en el estilo de vida segn las recomendaciones del mdico. Converse con el profesional que lo asiste sobre:  Las pruebas de deteccin y los anlisis que debe realizarse. Una prueba de deteccin es un estudio que se para detectar la presencia de una enfermedad cuando no tiene sntomas.  Un plan de dieta y ejercicios adecuado para usted. Qu debo saber sobre las pruebas de deteccin y los anlisis para prevenir cadas? Realizarse pruebas de deteccin y anlisis es la mejor manera de detectar un problema de salud de forma temprana. El diagnstico y tratamiento tempranos le brindan la mejor oportunidad de controlar las afecciones mdicas que son comunes despus de los 65 aos de edad. Ciertas afecciones y elecciones de estilo de vida pueden hacer que sea ms propenso a sufrir una cada. El mdico puede recomendarle lo siguiente:  Controles regulares de la visin. Una visin deficiente y afecciones como las cataratas pueden hacer que sea ms propenso a sufrir una cada. Si usa lentes, asegrese de obtener una receta actualizada si su visin cambia.  Revisin de medicamentos. Revise regularmente con el mdico todos los medicamentos que toma, incluidos los medicamentos de venta libre. Consulte al mdico sobre los efectos secundarios que pueden hacer que sea ms propenso a sufrir una cada. Informe al mdico si alguno de los medicamentos que toma lo hace  sentir mareado o somnoliento.  Pruebas de deteccin para la osteoporosis. La osteoporosis es una afeccin que hace que los huesos se vuelvan ms frgiles. En consecuencia, los huesos pueden debilitarse y quebrarse ms fcilmente.  Pruebas de deteccin para la presin arterial. Los cambios en la presin arterial y los medicamentos para controlar la presin arterial pueden hacerlo sentir mareado.  Controles de fuerza y equilibrio. El mdico puede recomendar ciertos estudios para controlar su fuerza y equilibrio al estar de pie, al caminar o al cambiar de posicin.  Examen de los pies. El dolor y el adormecimiento en los pies, como tambin no utilizar el calzado adecuado, pueden hacer que sea ms propenso a sufrir una cada.  Prueba de deteccin de la depresin. Es ms probable que sufra una cada si tiene miedo a caerse, se siente mal emocionalmente o se siente incapaz de realizar actividades que sola hacer.  Prueba de deteccin de consumo de alcohol. Beber demasiado alcohol puede afectar su equilibrio y puede hacer que sea ms propenso a sufrir una cada. Qu medidas puedo tomar para reducir mi riesgo de sufrir una cada? Instrucciones generales  Hable con el mdico sobre sus riesgos de sufrir una cada. Infrmele a su mdico si: ? Se cae. Asegrese de informarle a su mdico acerca de todas las cadas, incluso aquellas que parecen ser menores. ? Se siente mareado, somnoliento o que pierde el equilibrio.  Tome los medicamentos de venta libre y los recetados solamente como se lo haya indicado el mdico. Estos incluyen todos los suplementos.  Siga una dieta sana y mantenga un peso saludable. Una dieta saludable incluye   productos lcteos descremados, carnes bajas en contenido de grasa (magras, fibra de granos enteros, frijoles y muchas frutas y verduras. La seguridad en el hogar  Retire los objetos que puedan causar tropiezos tales como alfombras, cables u obstculos.  Instale equipos de  seguridad, como barras para sostn en los baos y barandas de seguridad en las escaleras.  Mantenga las habitaciones y los pasillos bien iluminados. Actividad   Siga un programa de ejercicio regular para mantenerse en forma. Esto lo ayudar a mantener el equilibrio. Consulte al mdico qu tipos de ejercicios son adecuados para usted.  Si necesita un bastn o un andador, selo segn las recomendaciones del mdico.  Utilice calzado con buen apoyo y suela antideslizante. Estilo de vida  No beba alcohol si el mdico le indica que no beba.  Si bebe alcohol, limite la cantidad que consume: ? De 0 a 1 medida por da para las mujeres. ? De 0 a 2 medidas por da para los hombres.  Est atento a la cantidad de alcohol que contiene su bebida. [place] los [place] una medida equivale a una botella tpica de cerveza (12 onzas), media copa de vino (5 onzas) o una medida de bebida blanca (1 onza).  No consuma ningn producto que contenga nicotina o tabaco, como cigarrillos y cigarrillos electrnicos. Si necesita ayuda para dejar de fumar, consulte al mdico. Resumen  Tener un estilo de vida saludable y recibir cuidados preventivos pueden ayudar a promover la salud y el bienestar despus de los 65 aos de edad.  Realizarse pruebas de deteccin y anlisis es la mejor manera de detectar un problema de salud de forma temprana y ayudarlo a evitar una cada. El diagnstico y tratamiento tempranos le brindan la mejor oportunidad de controlar las afecciones mdicas ms comunes en las personas mayores de 65 aos de edad.  Las cadas son la causa principal de las fracturas de huesos y lesiones en la cabeza de personas mayores de 65 aos de edad. Tome precauciones para evitar una cada en su casa.  Trabaje con el mdico para saber qu cambios que puede hacer para mejorar su salud y bienestar, y para prevenir las cadas. Esta informacin no tiene como fin reemplazar el consejo del mdico. Asegrese de hacerle al  mdico cualquier pregunta que tenga. Document Revised: 04/15/2017 Document Reviewed: 04/15/2017 Elsevier Patient Education  2020 Elsevier Inc.  

## 2019-10-31 LAB — CMP14+EGFR
ALT: 23 IU/L (ref 0–44)
AST: 24 IU/L (ref 0–40)
Albumin/Globulin Ratio: 1.8 (ref 1.2–2.2)
Albumin: 4.2 g/dL (ref 3.8–4.8)
Alkaline Phosphatase: 102 IU/L (ref 48–121)
BUN/Creatinine Ratio: 13 (ref 10–24)
BUN: 7 mg/dL — ABNORMAL LOW (ref 8–27)
Bilirubin Total: 0.3 mg/dL (ref 0.0–1.2)
CO2: 27 mmol/L (ref 20–29)
Calcium: 8.6 mg/dL (ref 8.6–10.2)
Chloride: 100 mmol/L (ref 96–106)
Creatinine, Ser: 0.54 mg/dL — ABNORMAL LOW (ref 0.76–1.27)
GFR calc Af Amer: 126 mL/min/{1.73_m2} (ref >59)
GFR calc non Af Amer: 109 mL/min/{1.73_m2} (ref >59)
Globulin, Total: 2.4 g/dL (ref 1.5–4.5)
Glucose: 101 mg/dL — ABNORMAL HIGH (ref 65–99)
Potassium: 3.5 mmol/L (ref 3.5–5.2)
Sodium: 140 mmol/L (ref 134–144)
Total Protein: 6.6 g/dL (ref 6.0–8.5)

## 2019-10-31 LAB — LIPID PANEL
Chol/HDL Ratio: 2.4 ratio (ref 0.0–5.0)
Cholesterol, Total: 180 mg/dL (ref 100–199)
HDL: 76 mg/dL (ref >39)
LDL Chol Calc (NIH): 81 mg/dL (ref 0–99)
Triglycerides: 135 mg/dL (ref 0–149)
VLDL Cholesterol Cal: 23 mg/dL (ref 5–40)

## 2019-10-31 LAB — CBC WITH DIFFERENTIAL/PLATELET
Basophils Absolute: 0.1 10*3/uL (ref 0.0–0.2)
Basos: 1 %
EOS (ABSOLUTE): 0.6 10*3/uL — ABNORMAL HIGH (ref 0.0–0.4)
Eos: 7 %
Hematocrit: 43.9 % (ref 37.5–51.0)
Hemoglobin: 14.3 g/dL (ref 13.0–17.7)
Immature Grans (Abs): 0.1 10*3/uL (ref 0.0–0.1)
Immature Granulocytes: 1 %
Lymphocytes Absolute: 1.9 10*3/uL (ref 0.7–3.1)
Lymphs: 24 %
MCH: 29.4 pg (ref 26.6–33.0)
MCHC: 32.6 g/dL (ref 31.5–35.7)
MCV: 90 fL (ref 79–97)
Monocytes Absolute: 0.6 10*3/uL (ref 0.1–0.9)
Monocytes: 8 %
Neutrophils Absolute: 4.7 10*3/uL (ref 1.4–7.0)
Neutrophils: 59 %
Platelets: 244 10*3/uL (ref 150–450)
RBC: 4.86 x10E6/uL (ref 4.14–5.80)
RDW: 14.3 % (ref 11.6–15.4)
WBC: 7.9 10*3/uL (ref 3.4–10.8)

## 2019-10-31 LAB — HCV RNA QUANT RFLX ULTRA OR GENOTYP: HCV Quant Baseline: NOT DETECTED IU/mL

## 2019-11-02 ENCOUNTER — Telehealth: Payer: Self-pay

## 2019-11-02 NOTE — Telephone Encounter (Signed)
Phone number on file is non working at this time.

## 2019-11-02 NOTE — Telephone Encounter (Signed)
-----   Message from Hoy Register, MD sent at 11/01/2019 12:46 PM EDT ----- Please inform the patient that labs are normal. Thank you.

## 2020-12-25 IMAGING — RF DG ELBOW 2V*L*
1 series · 2 of 2 positions shown · non-contrast
Comparison: 07/26/2019

CLINICAL DATA: Surgery

EXAM:
LEFT ELBOW - 2 VIEW; DG C-ARM 1-60 MIN

[Series 1: run · 2 of 2 slices shown]
[im 1/2]
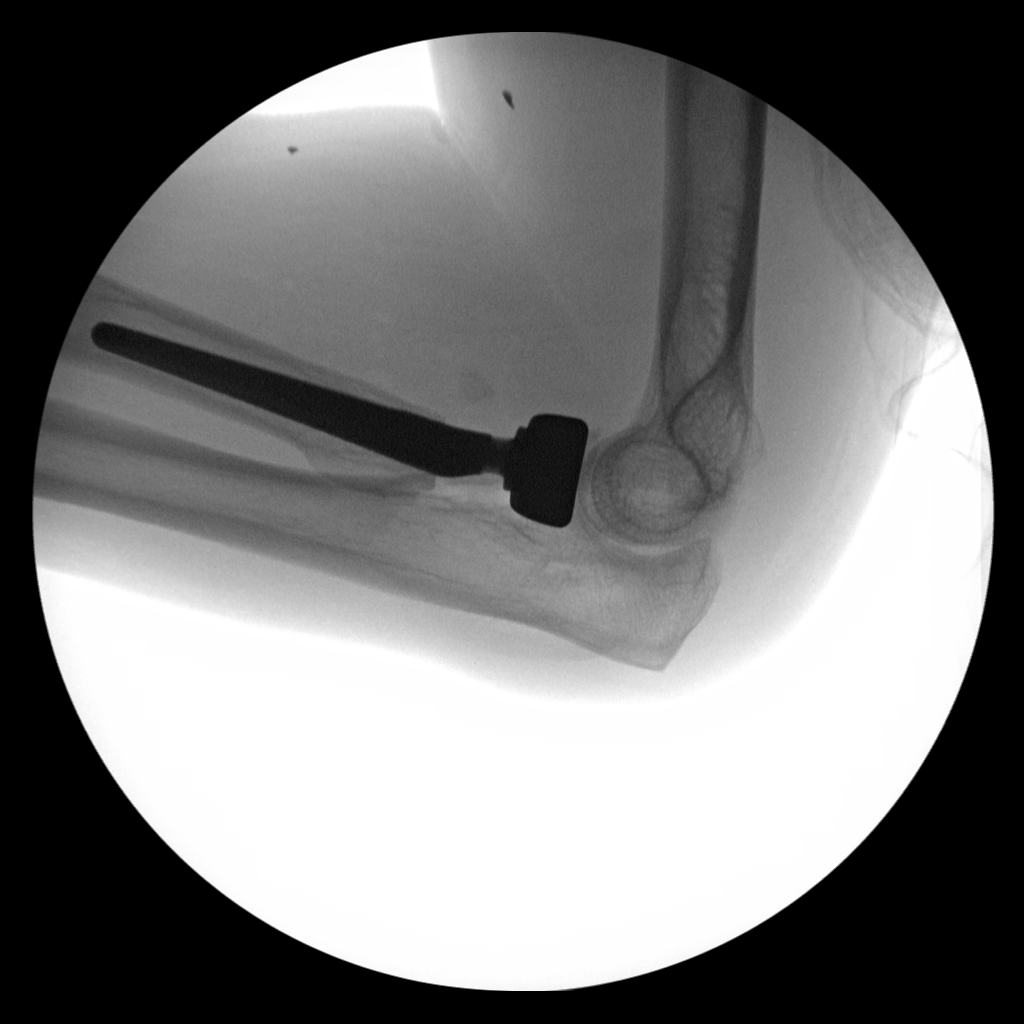
[im 2/2]
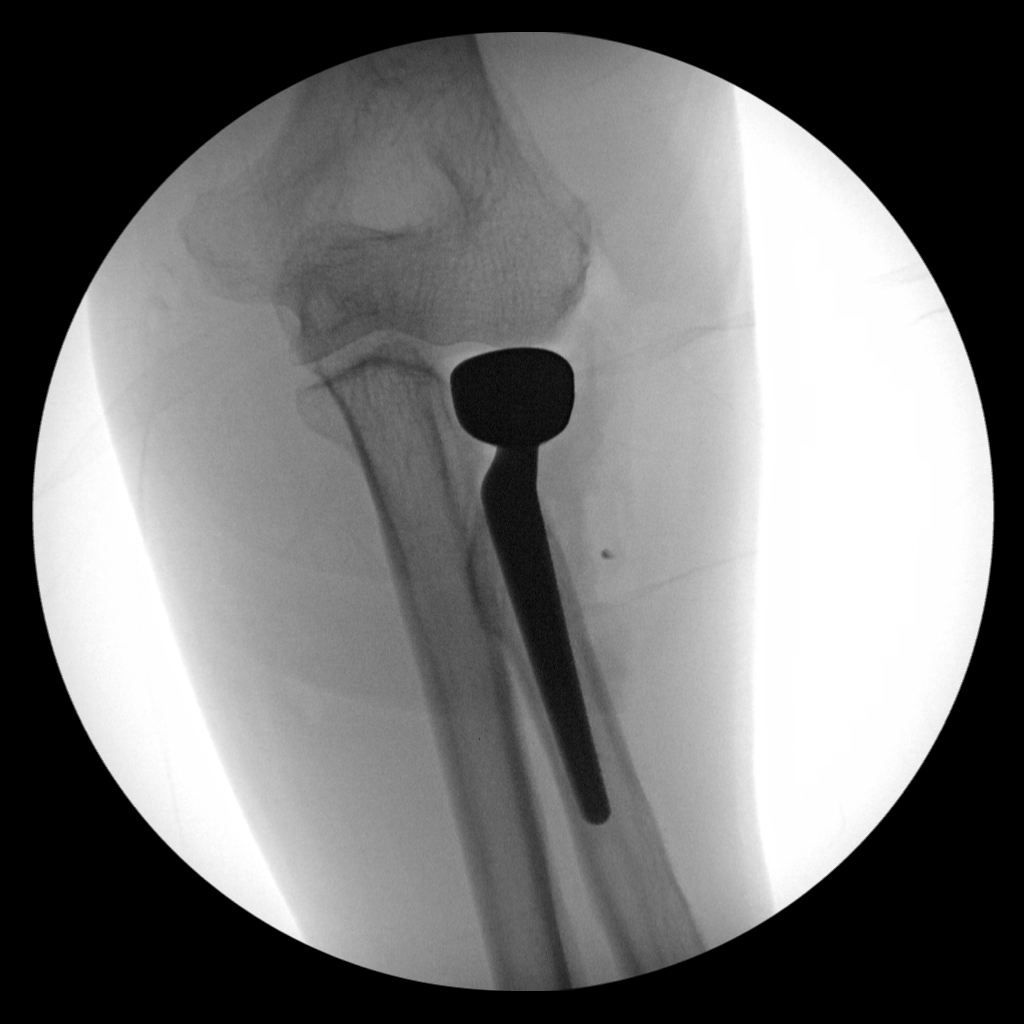

[2 of 2 positions shown; findings below may reference images not displayed]

FINDINGS: Interval radial head prosthesis placement within the left elbow. No
hardware bony complicating feature.
IMPRESSION: Intraoperative imaging as above.

## 2024-02-13 DIAGNOSIS — E872 Acidosis, unspecified: Secondary | ICD-10-CM | POA: Diagnosis present

## 2024-02-13 DIAGNOSIS — K920 Hematemesis: Secondary | ICD-10-CM | POA: Diagnosis present

## 2024-02-13 DIAGNOSIS — K259 Gastric ulcer, unspecified as acute or chronic, without hemorrhage or perforation: Secondary | ICD-10-CM

## 2024-02-13 DIAGNOSIS — K567 Ileus, unspecified: Secondary | ICD-10-CM | POA: Diagnosis present

## 2024-02-13 DIAGNOSIS — G252 Other specified forms of tremor: Secondary | ICD-10-CM | POA: Diagnosis present

## 2024-02-13 DIAGNOSIS — D72829 Elevated white blood cell count, unspecified: Secondary | ICD-10-CM | POA: Diagnosis present

## 2024-02-13 DIAGNOSIS — J9811 Atelectasis: Secondary | ICD-10-CM | POA: Diagnosis present

## 2024-02-13 DIAGNOSIS — R7989 Other specified abnormal findings of blood chemistry: Secondary | ICD-10-CM

## 2024-02-13 DIAGNOSIS — N179 Acute kidney failure, unspecified: Secondary | ICD-10-CM | POA: Diagnosis present

## 2024-02-13 DIAGNOSIS — F1010 Alcohol abuse, uncomplicated: Secondary | ICD-10-CM | POA: Diagnosis present

## 2024-02-13 DIAGNOSIS — F10939 Alcohol use, unspecified with withdrawal, unspecified: Secondary | ICD-10-CM | POA: Diagnosis present

## 2024-02-13 DIAGNOSIS — K922 Gastrointestinal hemorrhage, unspecified: Principal | ICD-10-CM | POA: Diagnosis present

## 2024-02-13 DIAGNOSIS — E279 Disorder of adrenal gland, unspecified: Secondary | ICD-10-CM | POA: Diagnosis present

## 2024-02-13 MED ADMIN — Pantoprazole Sodium For IV Soln 40 MG (Base Equiv): 40 mg | INTRAVENOUS | NDC 00008092351

## 2024-02-13 MED ADMIN — THERA M PLUS PO TABS: 1 | ORAL | NDC 1650058694

## 2024-02-13 MED ADMIN — Thiamine Mononitrate Tab 100 MG: 100 mg | ORAL | NDC 54629005701

## 2024-02-13 MED ADMIN — Sucralfate Susp 1 GM/10ML: 1 g | ORAL | NDC 50268074511

## 2024-02-13 MED ADMIN — Folic Acid Tab 1 MG: 1 mg | ORAL | NDC 70752023111

## 2024-02-13 MED ADMIN — Ondansetron HCl Inj 4 MG/2ML (2 MG/ML): 4 mg | INTRAVENOUS | NDC 00409475518

## 2024-02-13 MED ADMIN — Lactated Ringer's Solution: 500 mL | INTRAVENOUS

## 2024-02-13 MED FILL — Sucralfate Susp 1 GM/10ML: 1.0000 g | ORAL | Qty: 10 | Status: AC

## 2024-02-13 MED FILL — Multiple Vitamins w/ Minerals Tab: 1.0000 | ORAL | Qty: 1 | Status: AC

## 2024-02-13 MED FILL — Folic Acid Tab 1 MG: 1.0000 mg | ORAL | Qty: 1 | Status: AC

## 2024-02-13 MED FILL — Thiamine Mononitrate Tab 100 MG: 100.0000 mg | ORAL | Qty: 1 | Status: AC

## 2024-02-13 MED FILL — Pantoprazole Sodium For IV Soln 40 MG (Base Equiv): 40.0000 mg | INTRAVENOUS | Qty: 10 | Status: AC

## 2024-02-13 MED FILL — Ondansetron HCl Inj 4 MG/2ML (2 MG/ML): 4.0000 mg | INTRAMUSCULAR | Qty: 2 | Status: AC

## 2024-02-14 DIAGNOSIS — R4182 Altered mental status, unspecified: Secondary | ICD-10-CM

## 2024-02-14 DIAGNOSIS — K922 Gastrointestinal hemorrhage, unspecified: Principal | ICD-10-CM | POA: Diagnosis present

## 2024-02-14 DIAGNOSIS — R933 Abnormal findings on diagnostic imaging of other parts of digestive tract: Secondary | ICD-10-CM

## 2024-02-14 DIAGNOSIS — F10139 Alcohol abuse with withdrawal, unspecified: Secondary | ICD-10-CM

## 2024-02-14 SURGERY — CANCELLED PROCEDURE

## 2024-02-14 MED ADMIN — Pantoprazole Sodium For IV Soln 40 MG (Base Equiv): 40 mg | INTRAVENOUS | NDC 00008092351

## 2024-02-14 MED ADMIN — THERA M PLUS PO TABS: 1 | ORAL | NDC 1650058694

## 2024-02-14 MED ADMIN — Thiamine Mononitrate Tab 100 MG: 100 mg | ORAL | NDC 54629005701

## 2024-02-14 MED ADMIN — Simethicone Chew Tab 80 MG: 80 mg | ORAL | NDC 77333081225

## 2024-02-14 MED ADMIN — THIAMINE 500MG IN NORMAL SALINE (50ML) IVPB: 500 mg | INTRAVENOUS | NDC 63323001302

## 2024-02-14 MED ADMIN — Sucralfate Susp 1 GM/10ML: 1 g | ORAL | NDC 50268074511

## 2024-02-14 MED ADMIN — Potassium Chloride Microencapsulated Crys ER Tab 20 mEq: 40 meq | ORAL | NDC 00245531989

## 2024-02-14 MED ADMIN — Folic Acid Tab 1 MG: 1 mg | ORAL | NDC 70752023111

## 2024-02-14 MED ADMIN — Lorazepam Inj 2 MG/ML: 1 mg | INTRAVENOUS | NDC 00641604401

## 2024-02-14 MED FILL — Simethicone Chew Tab 80 MG: 80.0000 mg | ORAL | Qty: 1 | Status: AC

## 2024-02-14 MED FILL — Thiamine HCl Inj 100 MG/ML: 500.0000 mg | INTRAMUSCULAR | Qty: 5 | Status: AC

## 2024-02-15 DIAGNOSIS — K2950 Unspecified chronic gastritis without bleeding: Secondary | ICD-10-CM

## 2024-02-15 DIAGNOSIS — K259 Gastric ulcer, unspecified as acute or chronic, without hemorrhage or perforation: Secondary | ICD-10-CM

## 2024-02-15 DIAGNOSIS — K208 Other esophagitis without bleeding: Secondary | ICD-10-CM

## 2024-02-15 DIAGNOSIS — K31A15 Gastric intestinal metaplasia without dysplasia, involving multiple sites: Secondary | ICD-10-CM

## 2024-02-15 DIAGNOSIS — B9681 Helicobacter pylori [H. pylori] as the cause of diseases classified elsewhere: Secondary | ICD-10-CM

## 2024-02-15 DIAGNOSIS — F1010 Alcohol abuse, uncomplicated: Secondary | ICD-10-CM

## 2024-02-15 HISTORY — PX: ESOPHAGOGASTRODUODENOSCOPY: SHX5428

## 2024-02-15 SURGERY — EGD (ESOPHAGOGASTRODUODENOSCOPY)
Anesthesia: Monitor Anesthesia Care

## 2024-02-15 MED ADMIN — Pantoprazole Sodium For IV Soln 40 MG (Base Equiv): 40 mg | INTRAVENOUS | NDC 00008092351

## 2024-02-15 MED ADMIN — THERA M PLUS PO TABS: 1 | ORAL | NDC 1650058694

## 2024-02-15 MED ADMIN — Phenylephrine HCl IV Soln 10 MG/ML: 120 ug | INTRAVENOUS | NDC 00641614225

## 2024-02-15 MED ADMIN — Phenylephrine HCl IV Soln 10 MG/ML: 80 ug | INTRAVENOUS | NDC 00641614225

## 2024-02-15 MED ADMIN — Simethicone Chew Tab 80 MG: 80 mg | ORAL | NDC 77333081225

## 2024-02-15 MED ADMIN — Lidocaine HCl Local Soln Prefilled Syringe 100 MG/5ML (2%): 100 mg | INTRAVENOUS | NDC 70092117944

## 2024-02-15 MED ADMIN — THIAMINE 500MG IN NORMAL SALINE (50ML) IVPB: 500 mg | INTRAVENOUS | NDC 63323001301

## 2024-02-15 MED ADMIN — Sucralfate Susp 1 GM/10ML: 1 g | ORAL | NDC 50268074511

## 2024-02-15 MED ADMIN — PROPOFOL 200 MG/20ML IV EMUL: 50 mg | INTRAVENOUS | NDC 00069020901

## 2024-02-15 MED ADMIN — PROPOFOL 200 MG/20ML IV EMUL: 30 mg | INTRAVENOUS | NDC 00069020901

## 2024-02-15 MED ADMIN — Folic Acid Tab 1 MG: 1 mg | ORAL | NDC 70752023111

## 2024-02-15 MED ADMIN — Glycopyrrolate Inj PF Soln Prefilled Syringe 0.2 MG/ML: .2 mg | INTRAVENOUS | NDC 76045020310

## 2024-02-15 MED ADMIN — Propofol IV Emul 500 MG/50ML (10 MG/ML): 115 ug/kg/min | INTRAVENOUS | NDC 00069023401

## 2024-02-16 DIAGNOSIS — G252 Other specified forms of tremor: Secondary | ICD-10-CM

## 2024-02-16 DIAGNOSIS — N179 Acute kidney failure, unspecified: Secondary | ICD-10-CM

## 2024-02-16 DIAGNOSIS — K922 Gastrointestinal hemorrhage, unspecified: Secondary | ICD-10-CM

## 2024-02-16 DIAGNOSIS — E279 Disorder of adrenal gland, unspecified: Secondary | ICD-10-CM

## 2024-02-16 MED ADMIN — Pantoprazole Sodium For IV Soln 40 MG (Base Equiv): 40 mg | INTRAVENOUS | NDC 00008092351

## 2024-02-16 MED ADMIN — THERA M PLUS PO TABS: 1 | ORAL | NDC 1650058694

## 2024-02-16 MED ADMIN — Pantoprazole Sodium EC Tab 40 MG (Base Equiv): 40 mg | ORAL | NDC 65862056099

## 2024-02-16 MED ADMIN — Simethicone Chew Tab 80 MG: 80 mg | ORAL | NDC 77333081225

## 2024-02-16 MED ADMIN — THIAMINE 500MG IN NORMAL SALINE (50ML) IVPB: 500 mg | INTRAVENOUS | NDC 63323001301

## 2024-02-16 MED ADMIN — Sucralfate Susp 1 GM/10ML: 1 g | ORAL | NDC 50268074511

## 2024-02-16 MED ADMIN — Potassium Chloride Powder Packet 20 mEq: 40 meq | ORAL | NDC 00245036089

## 2024-02-16 MED ADMIN — Folic Acid Tab 1 MG: 1 mg | ORAL | NDC 70752023111

## 2024-02-16 MED FILL — Pantoprazole Sodium EC Tab 40 MG (Base Equiv): 40.0000 mg | ORAL | Qty: 1 | Status: AC

## 2024-02-17 ENCOUNTER — Other Ambulatory Visit (HOSPITAL_COMMUNITY): Payer: Self-pay

## 2024-02-17 ENCOUNTER — Encounter: Payer: Self-pay | Admitting: Gastroenterology

## 2024-02-17 DIAGNOSIS — F1010 Alcohol abuse, uncomplicated: Secondary | ICD-10-CM

## 2024-02-17 LAB — BASIC METABOLIC PANEL WITH GFR
Anion gap: 7 (ref 5–15)
BUN: 25 mg/dL — ABNORMAL HIGH (ref 8–23)
CO2: 26 mmol/L (ref 22–32)
Calcium: 8.3 mg/dL — ABNORMAL LOW (ref 8.9–10.3)
Chloride: 105 mmol/L (ref 98–111)
Creatinine, Ser: 1.02 mg/dL (ref 0.61–1.24)
GFR, Estimated: >60 mL/min (ref >60)
Glucose, Bld: 91 mg/dL (ref 70–99)
Potassium: 3.3 mmol/L — ABNORMAL LOW (ref 3.5–5.1)
Sodium: 138 mmol/L (ref 135–145)

## 2024-02-17 LAB — CBC
HCT: 31.8 % — ABNORMAL LOW (ref 39.0–52.0)
Hemoglobin: 10.9 g/dL — ABNORMAL LOW (ref 13.0–17.0)
MCH: 31.4 pg (ref 26.0–34.0)
MCHC: 34.3 g/dL (ref 30.0–36.0)
MCV: 91.6 fL (ref 80.0–100.0)
Platelets: 164 K/uL (ref 150–400)
RBC: 3.47 MIL/uL — ABNORMAL LOW (ref 4.22–5.81)
RDW: 12.4 % (ref 11.5–15.5)
WBC: 7.8 K/uL (ref 4.0–10.5)
nRBC: 0 % (ref 0.0–0.2)

## 2024-02-17 LAB — GLUCOSE, CAPILLARY
Glucose-Capillary: 91 mg/dL (ref 70–99)
Glucose-Capillary: 104 mg/dL — ABNORMAL HIGH (ref 70–99)

## 2024-02-17 LAB — MAGNESIUM: Magnesium: 1.7 mg/dL (ref 1.7–2.4)

## 2024-02-17 LAB — SURGICAL PATHOLOGY

## 2024-02-17 MED ORDER — POTASSIUM CHLORIDE 20 MEQ PO PACK
40.0000 meq | PACK | Freq: Once | ORAL | Status: AC
  Administered 2024-02-17: 40 meq via ORAL
  Filled 2024-02-17: qty 2

## 2024-02-17 MED ORDER — SUCRALFATE 1 G PO TABS
1.0000 g | ORAL_TABLET | Freq: Three times a day (TID) | ORAL | 0 refills | Status: AC
  Filled 2024-02-17: qty 28, 7d supply, fill #0

## 2024-02-17 MED ORDER — PANTOPRAZOLE SODIUM 40 MG PO TBEC
40.0000 mg | DELAYED_RELEASE_TABLET | Freq: Two times a day (BID) | ORAL | 3 refills | Status: AC
  Filled 2024-02-17: qty 60, 30d supply, fill #0

## 2024-02-17 MED ORDER — MAGNESIUM SULFATE IN D5W 1-5 GM/100ML-% IV SOLN
1.0000 g | Freq: Once | INTRAVENOUS | Status: AC
  Administered 2024-02-17: 1 g via INTRAVENOUS
  Filled 2024-02-17: qty 100

## 2024-02-17 MED ADMIN — THERA M PLUS PO TABS: 1 | ORAL | NDC 1650058694

## 2024-02-17 MED ADMIN — Pantoprazole Sodium EC Tab 40 MG (Base Equiv): 40 mg | ORAL | NDC 65862056099

## 2024-02-17 MED ADMIN — Simethicone Chew Tab 80 MG: 80 mg | ORAL | NDC 77333081225

## 2024-02-17 MED ADMIN — THIAMINE 500MG IN NORMAL SALINE (50ML) IVPB: 500 mg | INTRAVENOUS | NDC 63323001301

## 2024-02-17 MED ADMIN — Sucralfate Susp 1 GM/10ML: 1 g | ORAL | NDC 50268074511

## 2024-02-17 MED ADMIN — Folic Acid Tab 1 MG: 1 mg | ORAL | NDC 70752023111
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
## 2024-02-17 NOTE — TOC Transition Note (Signed)
 Transition of Care [facility]) - Discharge Note   Patient Details  Name: Eduin Friedel MRN: [id number] Date of Birth: 1950/12/19  [facility]) CM/SW Contact:  Waddell Barnie Rama, RN Phone Number: 02/17/2024, 11:53 AM   Clinical Narrative:    For dc today, he has follow up on AVS, he will need a cab to address of [address], [place] [address] at [employer].  TOC to fill his medications. NCM assisted with Match Card.         Patient Goals and CMS Choice            Discharge Placement                       Discharge Plan and Services Additional resources added to the After Visit Summary for                                       Social Drivers of Health (SDOH) Interventions SDOH Screenings   Food Insecurity: Patient Declined (02/13/2024)  Tobacco Use: Low Risk  (02/15/2024)     Readmission Risk Interventions     No data to display

## 2024-02-17 NOTE — TOC CM/SW Note (Addendum)
 Transition of Care [facility]) - Inpatient Brief Assessment   Patient Details  Name: Jefferson Fullam MRN: [id number] Date of Birth: 03-Dec-1950  [facility]) CM/SW Contact:    Waddell Barnie Rama, RN Phone Number: 02/17/2024, 11:45 AM   Clinical Narrative: NCM called Raquel the Spanish Interpreter , she came to room, From home with friends, has PCP follow [facility] and insurance on file, states has no HH services in place at this time or DME at home.  States he will need a cab to transport him home at address of [address] [place] [address].   Pta self ambulatory.   He has no funds with him to get medications if he needs any meds NCM will assist him with Match Card.  NCM assisted him with the clothes closet for shirt, pants, sweat shirt, underwear, socks, gloves and shoes.       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

## 2024-02-17 NOTE — Progress Notes (Signed)
 Discharge instructions provided to patient with in person interpreter. All medications, follow up appointments, and discharge instructions provided. GI appointment made and provided to patient. IV out. Monitor off CCMD notified. Patient discharging by cab.
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
## 2024-03-29 ENCOUNTER — Ambulatory Visit: Payer: Self-pay | Admitting: Gastroenterology
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
## 2024-04-06 ENCOUNTER — Ambulatory Visit (INDEPENDENT_AMBULATORY_CARE_PROVIDER_SITE_OTHER): Payer: Self-pay | Admitting: Primary Care
